# Patient Record
Sex: Male | Born: 1935 | Race: Black or African American | Hispanic: No | Marital: Married | Smoking: Former smoker
Health system: Southern US, Community
[De-identification: ages and names within clinical notes are randomized; demographics above are authoritative.]

## PROBLEM LIST (undated history)

## (undated) DIAGNOSIS — F0390 Unspecified dementia without behavioral disturbance: Secondary | ICD-10-CM

## (undated) DIAGNOSIS — N289 Disorder of kidney and ureter, unspecified: Secondary | ICD-10-CM

## (undated) DIAGNOSIS — I10 Essential (primary) hypertension: Secondary | ICD-10-CM

## (undated) DIAGNOSIS — E785 Hyperlipidemia, unspecified: Secondary | ICD-10-CM

## (undated) DIAGNOSIS — E119 Type 2 diabetes mellitus without complications: Secondary | ICD-10-CM

---

## 2009-05-14 ENCOUNTER — Ambulatory Visit: Payer: Self-pay | Admitting: *Deleted

## 2009-10-18 ENCOUNTER — Emergency Department: Payer: Self-pay | Admitting: Emergency Medicine

## 2009-12-20 ENCOUNTER — Emergency Department: Payer: Self-pay | Admitting: Emergency Medicine

## 2009-12-28 ENCOUNTER — Emergency Department: Payer: Self-pay | Admitting: Emergency Medicine

## 2013-07-03 DEATH — deceased

## 2018-01-04 ENCOUNTER — Encounter: Payer: Self-pay | Admitting: Emergency Medicine

## 2018-01-04 ENCOUNTER — Emergency Department
Admission: EM | Admit: 2018-01-04 | Discharge: 2018-01-04 | Disposition: A | Discharge status: Home / Self Care | Payer: Medicare Other | Attending: Emergency Medicine | Admitting: Emergency Medicine

## 2018-01-04 ENCOUNTER — Other Ambulatory Visit: Payer: Self-pay

## 2018-01-04 DIAGNOSIS — E119 Type 2 diabetes mellitus without complications: Secondary | ICD-10-CM | POA: Insufficient documentation

## 2018-01-04 DIAGNOSIS — F0390 Unspecified dementia without behavioral disturbance: Secondary | ICD-10-CM | POA: Insufficient documentation

## 2018-01-04 DIAGNOSIS — I10 Essential (primary) hypertension: Secondary | ICD-10-CM | POA: Insufficient documentation

## 2018-01-04 DIAGNOSIS — Z87891 Personal history of nicotine dependence: Secondary | ICD-10-CM | POA: Insufficient documentation

## 2018-01-04 DIAGNOSIS — R531 Weakness: Secondary | ICD-10-CM

## 2018-01-04 DIAGNOSIS — R42 Dizziness and giddiness: Secondary | ICD-10-CM

## 2018-01-04 HISTORY — DX: Essential (primary) hypertension: I10

## 2018-01-04 HISTORY — DX: Hyperlipidemia, unspecified: E78.5

## 2018-01-04 HISTORY — DX: Type 2 diabetes mellitus without complications: E11.9

## 2018-01-04 HISTORY — DX: Unspecified dementia, unspecified severity, without behavioral disturbance, psychotic disturbance, mood disturbance, and anxiety: F03.90

## 2018-01-04 HISTORY — DX: Disorder of kidney and ureter, unspecified: N28.9

## 2018-01-04 LAB — URINALYSIS, COMPLETE (UACMP) WITH MICROSCOPIC
Glucose, UA: 50 mg/dL — AB
HGB URINE DIPSTICK: NEGATIVE
Ketones, ur: NEGATIVE mg/dL
LEUKOCYTES UA: NEGATIVE
Nitrite: NEGATIVE
PH: 5 (ref 5.0–8.0)
Protein, ur: 100 mg/dL — AB
SPECIFIC GRAVITY, URINE: 1.024 (ref 1.005–1.030)

## 2018-01-04 LAB — CBC
HCT: 38.8 % — ABNORMAL LOW (ref 40.0–52.0)
Hemoglobin: 13.1 g/dL (ref 13.0–18.0)
MCH: 28.8 pg (ref 26.0–34.0)
MCHC: 33.9 g/dL (ref 32.0–36.0)
MCV: 85 fL (ref 80.0–100.0)
PLATELETS: 290 10*3/uL (ref 150–440)
RBC: 4.56 MIL/uL (ref 4.40–5.90)
RDW: 13.7 % (ref 11.5–14.5)
WBC: 7.8 10*3/uL (ref 3.8–10.6)

## 2018-01-04 LAB — BASIC METABOLIC PANEL
Anion gap: 12 (ref 5–15)
BUN: 30 mg/dL — AB (ref 8–23)
CALCIUM: 9.2 mg/dL (ref 8.9–10.3)
CO2: 21 mmol/L — ABNORMAL LOW (ref 22–32)
Chloride: 104 mmol/L (ref 98–111)
Creatinine, Ser: 2.81 mg/dL — ABNORMAL HIGH (ref 0.61–1.24)
GFR, EST AFRICAN AMERICAN: 22 mL/min — AB (ref >60)
GFR, EST NON AFRICAN AMERICAN: 19 mL/min — AB (ref >60)
Glucose, Bld: 157 mg/dL — ABNORMAL HIGH (ref 70–99)
Potassium: 4.1 mmol/L (ref 3.5–5.1)
SODIUM: 137 mmol/L (ref 135–145)

## 2018-01-04 LAB — TROPONIN I: Troponin I: <0.03 ng/mL (ref <0.03)

## 2018-01-04 MED ORDER — CEPHALEXIN 250 MG PO CAPS: 250.0000 mg | ORAL_CAPSULE | Freq: Four times a day (QID) | ORAL | 0 refills | Status: AC

## 2018-01-04 NOTE — ED Provider Notes (Signed)
[facility] Emergency Department Provider Note       Time seen: ----------------------------------------- 8:50 PM on 01/04/2018 -----------------------------------------   I have reviewed the triage vital signs and the nursing notes.  HISTORY   Chief Complaint Weakness    HPI Shawn May is a 84 y.o. male with a history of dementia, diabetes, hyperlipidemia, hypertension and renal disorder who presents to the ED for creased weakness and dizziness for the past week.  Wife states he has some dementia which has been progressing.  He had a fall today but denies hitting his head.  He does not actually remember this event.  According to EMS blood pressure was running with a systolic in the 41R.  Past Medical History:  Diagnosis Date  . Dementia   . Diabetes mellitus without complication ([place])   . Hyperlipidemia   . Hypertension   . Renal disorder     There are no active problems to display for this patient.   History reviewed. No pertinent surgical history.  Allergies Patient has no known allergies.  Social History Social History   Tobacco Use  . Smoking status: Former Research scientist (life sciences)  . Smokeless tobacco: Never Used  Substance Use Topics  . Alcohol use: Not Currently  . Drug use: Not Currently   Review of Systems Constitutional: Negative for fever. Cardiovascular: Negative for chest pain. Respiratory: Negative for shortness of breath. Gastrointestinal: Negative for abdominal pain, vomiting and diarrhea. Musculoskeletal: Negative for back pain. Skin: Negative for rash. Neurological: Positive for weakness and dizziness  All systems negative/normal/unremarkable except as stated in the HPI  ____________________________________________   PHYSICAL EXAM:  VITAL SIGNS: ED Triage Vitals  Enc Vitals Group     BP 01/04/18 1655 (!) 95/56     Pulse Rate 01/04/18 1655 86     Resp --      Temp 01/04/18 1655 98.2 F (36.8 C)     Temp Source  01/04/18 1655 Oral     SpO2 01/04/18 1655 93 %     Weight 01/04/18 1656 150 lb (68 kg)     Height 01/04/18 1656 5\' 7"  (1.702 m)     Head Circumference --      Peak Flow --      Pain Score 01/04/18 1705 0     Pain Loc --      Pain Edu? --      Excl. in [place]? --    Constitutional: Alert and oriented. Well appearing and in no distress. Eyes: Conjunctivae are normal. Normal extraocular movements. ENT   Head: Normocephalic and atraumatic.   Nose: No congestion/rhinnorhea.   Mouth/Throat: Mucous membranes are moist.   Neck: No stridor. Cardiovascular: Normal rate, regular rhythm. No murmurs, rubs, or gallops. Respiratory: Normal respiratory effort without tachypnea nor retractions. Breath sounds are clear and equal bilaterally. No wheezes/rales/rhonchi. Gastrointestinal: Soft and nontender. Normal bowel sounds Musculoskeletal: Nontender with normal range of motion in extremities. No lower extremity tenderness nor edema. Neurologic:  Normal speech and language. No gross focal neurologic deficits are appreciated.  Skin:  Skin is warm, dry and intact. No rash noted. Psychiatric: Mood and affect are normal. Speech and behavior are normal.  ____________________________________________  EKG: Interpreted by me.  Sinus rhythm the rate 83 bpm, normal PR interval, normal QRS, normal QT  ____________________________________________  ED COURSE:  As part of my medical decision making, I reviewed the following data within the [place] History obtained from family if available, nursing notes, old chart and  ekg, as well as notes from prior ED visits. Patient presented for weakness and dizziness, we will assess with labs and imaging as indicated at this time. Clinical Course as of Jan 04 2149  Fri Jan 04, 2018  2110 Patient was not symptomatic with any orthostatic changes   [JW]    Clinical Course User Index [JW] Earleen Newport, MD    Procedures ____________________________________________   LABS (pertinent positives/negatives)  Labs Reviewed  BASIC METABOLIC PANEL - Abnormal; Notable for the following components:      Result Value   CO2 21 (*)    Glucose, Bld 157 (*)    BUN 30 (*)    Creatinine, Ser 2.81 (*)    GFR calc non Af Amer 19 (*)    GFR calc Af Amer 22 (*)    All other components within normal limits  CBC - Abnormal; Notable for the following components:   HCT 38.8 (*)    All other components within normal limits  URINALYSIS, COMPLETE (UACMP) WITH MICROSCOPIC - Abnormal; Notable for the following components:   Color, Urine AMBER (*)    APPearance CLOUDY (*)    Glucose, UA 50 (*)    Bilirubin Urine SMALL (*)    Protein, ur 100 (*)    Bacteria, UA RARE (*)    All other components within normal limits  TROPONIN I  CBG MONITORING, ED  ___________________________________________  DIFFERENTIAL DIAGNOSIS   Dehydration, electrolyte abnormality, medication side effect, occult infection  FINAL ASSESSMENT AND PLAN  Weakness, dizziness   Plan: The patient had presented for weakness and dizziness. Patient's labs did indicate mild UTI, otherwise he has chronic kidney disease that is unchanged.  Patient was not symptomatic with any orthostatic changes here although his blood pressure did decrease from lying to standing.  I will encourage increase fluid intake as well as holding his HCTZ until close outpatient follow-up with his doctor.   Laurence Aly, MD   Note: This note was generated in part or whole with voice recognition software. Voice recognition is usually quite accurate but there are transcription errors that can and very often do occur. I apologize for any typographical errors that were not detected and corrected.     Earleen Newport, MD 01/04/18 2203

## 2018-01-04 NOTE — ED Triage Notes (Signed)
Pt comes into the ED via PV c/o increased weakness and dizziness for over a weak.  The wife states he has some dementia and it has progressed.  Patient also had a fall today but denies hitting his head.  Patient states he doesn't remember that happening today but his wife witnessed it. Patient has no complaints and denies any pain.

## 2018-01-04 NOTE — ED Notes (Addendum)
First Nurse Note:  Patient presents to the ED via EMS.  Per EMS patient has had multiple falls and dizziness lately.  Per EMS blood pressure was running systolic in the 26E.

## 2018-01-04 NOTE — ED Notes (Signed)
Pt reports dizziness.  States I feel fine.  Denies any pain.  No chest pain or sob.

## 2018-06-28 ENCOUNTER — Ambulatory Visit: Payer: Medicare Other | Admitting: Podiatry

## 2018-06-28 ENCOUNTER — Encounter: Payer: Self-pay | Admitting: Podiatry

## 2018-06-28 VITALS — BP 129/60 | HR 68

## 2018-06-28 DIAGNOSIS — B351 Tinea unguium: Secondary | ICD-10-CM | POA: Diagnosis not present

## 2018-06-28 DIAGNOSIS — M79676 Pain in unspecified toe(s): Secondary | ICD-10-CM | POA: Diagnosis not present

## 2018-06-30 NOTE — Progress Notes (Signed)
   SUBJECTIVE Patient with a history of diabetes mellitus presents to office today complaining of elongated, thickened nails that cause pain while ambulating in shoes. He is unable to trim his own nails. Patient is here for further evaluation and treatment.   Past Medical History:  Diagnosis Date  . Dementia ([place])   . Diabetes mellitus without complication ([place])   . Hyperlipidemia   . Hypertension   . Renal disorder     OBJECTIVE General Patient is awake, alert, and oriented x 3 and in no acute distress. Derm Skin is dry and supple bilateral. Negative open lesions or macerations. Remaining integument unremarkable. Nails are tender, long, thickened and dystrophic with subungual debris, consistent with onychomycosis, 1-5 bilateral. No signs of infection noted. Vasc  DP and PT pedal pulses palpable bilaterally. Temperature gradient within normal limits.  Neuro Epicritic and protective threshold sensation diminished bilaterally.  Musculoskeletal Exam No symptomatic pedal deformities noted bilateral. Muscular strength within normal limits.  ASSESSMENT 1. Diabetes Mellitus w/ peripheral neuropathy 2. Onychomycosis of nail due to dermatophyte bilateral 3. Pain in foot bilateral  PLAN OF CARE 1. Patient evaluated today. 2. Instructed to maintain good pedal hygiene and foot care. Stressed importance of controlling blood sugar.  3. Mechanical debridement of nails 1-5 bilaterally performed using a nail nipper. Filed with dremel without incident.  4. Return to clinic in 3 mos.     Edrick Kins, DPM [facility]  Dr. Edrick Kins, [employer]                                        [place], [place] [address]                Office [phone]  Fax [phone]

## 2018-07-09 ENCOUNTER — Encounter: Payer: Self-pay | Admitting: Podiatry

## 2018-07-09 ENCOUNTER — Ambulatory Visit: Payer: Medicare Other | Admitting: Podiatry

## 2018-07-09 DIAGNOSIS — L600 Ingrowing nail: Secondary | ICD-10-CM | POA: Diagnosis not present

## 2018-07-11 NOTE — Progress Notes (Signed)
   Subjective: Patient presents today for evaluation of pain to the medial border of the left hallux that began a few weeks ago. Patient is concerned for possible ingrown nail. Applying pressure and wearing shoes increases the pain. He has not done anything for treatment. Patient presents today for further treatment and evaluation.  Past Medical History:  Diagnosis Date  . Dementia ([place])   . Diabetes mellitus without complication ([place])   . Hyperlipidemia   . Hypertension   . Renal disorder     Objective:  General: Well developed, nourished, in no acute distress, alert and oriented x3   Dermatology: Skin is warm, dry and supple bilateral. Medial border of the left hallux appears to be erythematous with evidence of an ingrowing nail. Pain on palpation noted to the border of the nail fold. The remaining nails appear unremarkable at this time. There are no open sores, lesions.  Vascular: Dorsalis Pedis artery and Posterior Tibial artery pedal pulses palpable. No lower extremity edema noted.   Neruologic: Grossly intact via light touch bilateral.  Musculoskeletal: Muscular strength within normal limits in all groups bilateral. Normal range of motion noted to all pedal and ankle joints.   Assesement: #1 Paronychia with ingrowing nail medial border left hallux  #2 Pain in toe #3 Incurvated nail  Plan of Care:  1. Patient evaluated.  2. Discussed treatment alternatives and plan of care. Explained nail avulsion procedure and post procedure course to patient. 3. Patient opted for temporary partial nail avulsion of the medial border of the left hallux .  4. Prior to procedure, local anesthesia infiltration utilized using 3 ml of a 50:50 mixture of 2% plain lidocaine and 0.5% plain marcaine in a normal hallux block fashion and a betadine prep performed.  5. Light dressing applied. 6. Return to clinic in 3 weeks.   Edrick Kins, DPM [facility]  Dr. Edrick Kins, [address]                                        [place], [place] [address]                Office [phone]  Fax [phone]

## 2018-07-30 ENCOUNTER — Encounter: Payer: Medicare Other | Admitting: Podiatry

## 2018-08-11 NOTE — Progress Notes (Signed)
This encounter was created in error - please disregard.

## 2018-09-11 ENCOUNTER — Emergency Department: Payer: Medicare Other

## 2018-09-11 ENCOUNTER — Encounter: Payer: Self-pay | Admitting: Emergency Medicine

## 2018-09-11 ENCOUNTER — Observation Stay
Admission: EM | Admit: 2018-09-11 | Discharge: 2018-09-12 | Disposition: A | Discharge status: Home / Self Care | Payer: Medicare Other | Attending: Internal Medicine | Admitting: Internal Medicine

## 2018-09-11 ENCOUNTER — Inpatient Hospital Stay: Payer: Medicare Other

## 2018-09-11 ENCOUNTER — Other Ambulatory Visit: Payer: Self-pay

## 2018-09-11 DIAGNOSIS — E1122 Type 2 diabetes mellitus with diabetic chronic kidney disease: Secondary | ICD-10-CM | POA: Diagnosis not present

## 2018-09-11 DIAGNOSIS — N183 Chronic kidney disease, stage 3 (moderate): Secondary | ICD-10-CM | POA: Diagnosis not present

## 2018-09-11 DIAGNOSIS — R739 Hyperglycemia, unspecified: Secondary | ICD-10-CM

## 2018-09-11 DIAGNOSIS — Z87891 Personal history of nicotine dependence: Secondary | ICD-10-CM | POA: Insufficient documentation

## 2018-09-11 DIAGNOSIS — E785 Hyperlipidemia, unspecified: Secondary | ICD-10-CM | POA: Diagnosis not present

## 2018-09-11 DIAGNOSIS — Z79899 Other long term (current) drug therapy: Secondary | ICD-10-CM | POA: Insufficient documentation

## 2018-09-11 DIAGNOSIS — Z833 Family history of diabetes mellitus: Secondary | ICD-10-CM | POA: Diagnosis not present

## 2018-09-11 DIAGNOSIS — Z66 Do not resuscitate: Secondary | ICD-10-CM | POA: Diagnosis not present

## 2018-09-11 DIAGNOSIS — E1165 Type 2 diabetes mellitus with hyperglycemia: Secondary | ICD-10-CM | POA: Diagnosis not present

## 2018-09-11 DIAGNOSIS — I6381 Other cerebral infarction due to occlusion or stenosis of small artery: Secondary | ICD-10-CM | POA: Diagnosis not present

## 2018-09-11 DIAGNOSIS — F0390 Unspecified dementia without behavioral disturbance: Secondary | ICD-10-CM | POA: Insufficient documentation

## 2018-09-11 DIAGNOSIS — E860 Dehydration: Secondary | ICD-10-CM | POA: Insufficient documentation

## 2018-09-11 DIAGNOSIS — N179 Acute kidney failure, unspecified: Secondary | ICD-10-CM | POA: Diagnosis not present

## 2018-09-11 DIAGNOSIS — Z7984 Long term (current) use of oral hypoglycemic drugs: Secondary | ICD-10-CM | POA: Insufficient documentation

## 2018-09-11 DIAGNOSIS — R2681 Unsteadiness on feet: Secondary | ICD-10-CM | POA: Diagnosis not present

## 2018-09-11 DIAGNOSIS — I6621 Occlusion and stenosis of right posterior cerebral artery: Secondary | ICD-10-CM | POA: Diagnosis not present

## 2018-09-11 DIAGNOSIS — I639 Cerebral infarction, unspecified: Secondary | ICD-10-CM

## 2018-09-11 DIAGNOSIS — E876 Hypokalemia: Secondary | ICD-10-CM | POA: Insufficient documentation

## 2018-09-11 DIAGNOSIS — E871 Hypo-osmolality and hyponatremia: Secondary | ICD-10-CM | POA: Diagnosis not present

## 2018-09-11 DIAGNOSIS — Z8249 Family history of ischemic heart disease and other diseases of the circulatory system: Secondary | ICD-10-CM | POA: Diagnosis not present

## 2018-09-11 DIAGNOSIS — I129 Hypertensive chronic kidney disease with stage 1 through stage 4 chronic kidney disease, or unspecified chronic kidney disease: Secondary | ICD-10-CM | POA: Insufficient documentation

## 2018-09-11 LAB — CBC
HEMATOCRIT: 36.4 % — AB (ref 39.0–52.0)
Hemoglobin: 12.2 g/dL — ABNORMAL LOW (ref 13.0–17.0)
MCH: 27.7 pg (ref 26.0–34.0)
MCHC: 33.5 g/dL (ref 30.0–36.0)
MCV: 82.7 fL (ref 80.0–100.0)
PLATELETS: 274 10*3/uL (ref 150–400)
RBC: 4.4 MIL/uL (ref 4.22–5.81)
RDW: 12.3 % (ref 11.5–15.5)
WBC: 6.7 10*3/uL (ref 4.0–10.5)
nRBC: 0 % (ref 0.0–0.2)

## 2018-09-11 LAB — COMPREHENSIVE METABOLIC PANEL
ALBUMIN: 4.2 g/dL (ref 3.5–5.0)
ALK PHOS: 82 U/L (ref 38–126)
ALT: 16 U/L (ref 0–44)
ANION GAP: 14 (ref 5–15)
AST: 24 U/L (ref 15–41)
BILIRUBIN TOTAL: 1.8 mg/dL — AB (ref 0.3–1.2)
BUN: 39 mg/dL — AB (ref 8–23)
CALCIUM: 9.2 mg/dL (ref 8.9–10.3)
CO2: 18 mmol/L — ABNORMAL LOW (ref 22–32)
Chloride: 96 mmol/L — ABNORMAL LOW (ref 98–111)
Creatinine, Ser: 2.95 mg/dL — ABNORMAL HIGH (ref 0.61–1.24)
GFR calc Af Amer: 22 mL/min — ABNORMAL LOW (ref >60)
GFR, EST NON AFRICAN AMERICAN: 19 mL/min — AB (ref >60)
GLUCOSE: 532 mg/dL — AB (ref 70–99)
POTASSIUM: 4.3 mmol/L (ref 3.5–5.1)
Sodium: 128 mmol/L — ABNORMAL LOW (ref 135–145)
TOTAL PROTEIN: 7.9 g/dL (ref 6.5–8.1)

## 2018-09-11 LAB — DIFFERENTIAL
Abs Immature Granulocytes: 0.06 10*3/uL (ref 0.00–0.07)
Basophils Absolute: 0 10*3/uL (ref 0.0–0.1)
Basophils Relative: 0 %
EOS ABS: 0 10*3/uL (ref 0.0–0.5)
EOS PCT: 0 %
IMMATURE GRANULOCYTES: 1 %
LYMPHS ABS: 0.9 10*3/uL (ref 0.7–4.0)
LYMPHS PCT: 13 %
MONOS PCT: 9 %
Monocytes Absolute: 0.6 10*3/uL (ref 0.1–1.0)
Neutro Abs: 5.1 10*3/uL (ref 1.7–7.7)
Neutrophils Relative %: 77 %

## 2018-09-11 LAB — URINALYSIS, COMPLETE (UACMP) WITH MICROSCOPIC
BACTERIA UA: NONE SEEN
BILIRUBIN URINE: NEGATIVE
Glucose, UA: >=500 mg/dL — AB
KETONES UR: NEGATIVE mg/dL
Leukocytes,Ua: NEGATIVE
Nitrite: NEGATIVE
Protein, ur: 30 mg/dL — AB
Specific Gravity, Urine: 1.021 (ref 1.005–1.030)
pH: 5 (ref 5.0–8.0)

## 2018-09-11 LAB — GLUCOSE, CAPILLARY
GLUCOSE-CAPILLARY: 526 mg/dL — AB (ref 70–99)
Glucose-Capillary: 469 mg/dL — ABNORMAL HIGH (ref 70–99)
Glucose-Capillary: 357 mg/dL — ABNORMAL HIGH (ref 70–99)

## 2018-09-11 LAB — HEMOGLOBIN A1C
Hgb A1c MFr Bld: 11.1 % — ABNORMAL HIGH (ref 4.8–5.6)
Mean Plasma Glucose: 271.87 mg/dL

## 2018-09-11 MED ORDER — SODIUM CHLORIDE 0.9 % IV SOLN
INTRAVENOUS | Status: DC
  Administered 2018-09-11 – 2018-09-12 (×2): via INTRAVENOUS

## 2018-09-11 MED ORDER — INSULIN ASPART 100 UNIT/ML ~~LOC~~ SOLN
0.0000 [IU] | Freq: Every day | SUBCUTANEOUS | Status: DC
  Administered 2018-09-11: 5 [IU] via SUBCUTANEOUS
  Filled 2018-09-11: qty 1

## 2018-09-11 MED ORDER — INSULIN ASPART 100 UNIT/ML ~~LOC~~ SOLN
0.0000 [IU] | Freq: Three times a day (TID) | SUBCUTANEOUS | Status: DC
  Administered 2018-09-12: 4 [IU] via SUBCUTANEOUS
  Administered 2018-09-12: 08:00:00 3 [IU] via SUBCUTANEOUS
  Filled 2018-09-11 (×2): qty 1

## 2018-09-11 MED ORDER — SODIUM CHLORIDE 0.9 % IV SOLN
1000.0000 mL | Freq: Once | INTRAVENOUS | Status: AC
  Administered 2018-09-11: 1000 mL via INTRAVENOUS

## 2018-09-11 MED ORDER — INSULIN GLARGINE 100 UNIT/ML ~~LOC~~ SOLN
20.0000 [IU] | Freq: Every day | SUBCUTANEOUS | Status: DC
Start: 2018-09-11 — End: 2018-09-12
  Administered 2018-09-11 – 2018-09-12 (×2): 20 [IU] via SUBCUTANEOUS
  Filled 2018-09-11 (×3): qty 0.2

## 2018-09-11 MED ORDER — ACETAMINOPHEN 325 MG PO TABS: 650.0000 mg | ORAL_TABLET | ORAL | Status: DC | PRN

## 2018-09-11 MED ORDER — ACETAMINOPHEN 160 MG/5ML PO SOLN
650.0000 mg | ORAL | Status: DC | PRN
  Filled 2018-09-11: qty 20.3

## 2018-09-11 MED ORDER — LISINOPRIL 10 MG PO TABS
10.0000 mg | ORAL_TABLET | Freq: Every day | ORAL | Status: DC
  Administered 2018-09-12: 10 mg via ORAL
  Filled 2018-09-11: qty 1

## 2018-09-11 MED ORDER — ASPIRIN 81 MG PO CHEW
81.0000 mg | CHEWABLE_TABLET | Freq: Every day | ORAL | Status: DC
  Administered 2018-09-11 – 2018-09-12 (×2): 81 mg via ORAL
  Filled 2018-09-11 (×2): qty 1

## 2018-09-11 MED ORDER — HEPARIN SODIUM (PORCINE) 5000 UNIT/ML IJ SOLN
5000.0000 [IU] | Freq: Three times a day (TID) | INTRAMUSCULAR | Status: DC
  Administered 2018-09-11 – 2018-09-12 (×3): 5000 [IU] via SUBCUTANEOUS
  Filled 2018-09-11 (×3): qty 1

## 2018-09-11 MED ORDER — ACETAMINOPHEN 650 MG RE SUPP: 650.0000 mg | RECTAL | Status: DC | PRN

## 2018-09-11 MED ORDER — RISPERIDONE 1 MG PO TBDP
0.5000 mg | ORAL_TABLET | Freq: Two times a day (BID) | ORAL | Status: DC | PRN
  Administered 2018-09-11: 23:00:00 0.5 mg via ORAL
  Filled 2018-09-11 (×2): qty 0.5

## 2018-09-11 MED ORDER — SODIUM BICARBONATE 650 MG PO TABS
650.0000 mg | ORAL_TABLET | Freq: Three times a day (TID) | ORAL | Status: DC
  Administered 2018-09-11 – 2018-09-12 (×2): 650 mg via ORAL
  Filled 2018-09-11 (×5): qty 1

## 2018-09-11 MED ORDER — ATORVASTATIN CALCIUM 20 MG PO TABS
40.0000 mg | ORAL_TABLET | Freq: Every day | ORAL | Status: DC
  Administered 2018-09-11: 40 mg via ORAL
  Filled 2018-09-11: qty 2

## 2018-09-11 MED ORDER — STROKE: EARLY STAGES OF RECOVERY BOOK
Freq: Once | Status: AC
  Administered 2018-09-11: 21:00:00

## 2018-09-11 NOTE — ED Provider Notes (Addendum)
[facility] Emergency Department Provider Note   ____________________________________________    I have reviewed the triage vital signs and the nursing notes.   HISTORY  Chief Complaint Altered Mental Status; Headache; Polydipsia; and Emesis  History is primarily from patient's wife   HPI Shawn May is a 85 y.o. male who presents with slurred speech, possible dehydration, dizziness.  Patient with a history of dementia, diabetes.  Wife reports that on Sunday 3 days ago the patient seemed to become more unsteady on his feet, prior to that he had no difficulty walking.  She also reports that he became more difficult to understand and his speech was slurred.  Additionally he has been keeping her up at night because he is continually "thirsty "and having to urinate he does have a history of diabetes.  Past Medical History:  Diagnosis Date  . Dementia ([place])   . Diabetes mellitus without complication ([place])   . Hyperlipidemia   . Hypertension   . Renal disorder     There are no active problems to display for this patient.   History reviewed. No pertinent surgical history.  Prior to Admission medications   Not on File     Allergies Patient has no known allergies.  No family history on file.  Social History Social History   Tobacco Use  . Smoking status: Former Research scientist (life sciences)  . Smokeless tobacco: Never Used  Substance Use Topics  . Alcohol use: Not Currently  . Drug use: Not Currently    Review of Systems  Constitutional: No fever/chills Eyes: No visual changes.  ENT: "Thirsty " Cardiovascular: Denies chest pain. Respiratory: No cough or shortness of Gastrointestinal:  no vomiting.   Genitourinary: Polyuria Musculoskeletal: Negative for back pain. Skin: Negative for rash. Neurological: Off balance   ____________________________________________   PHYSICAL EXAM:  VITAL SIGNS: ED Triage Vitals  Enc Vitals Group     BP 09/11/18  1244 (!) 147/80     Pulse Rate 09/11/18 1244 97     Resp 09/11/18 1244 16     Temp 09/11/18 1244 98.1 F (36.7 C)     Temp Source 09/11/18 1244 Oral     SpO2 09/11/18 1244 100 %     Weight 09/11/18 1245 74.8 kg (165 lb)     Height 09/11/18 1245 1.651 m (5\' 5" )     Head Circumference --      Peak Flow --      Pain Score --      Pain Loc --      Pain Edu? --      Excl. in [place]? --     Constitutional: Alert and oriented.  Eyes: Conjunctivae are normal.  PERRLA Head: Atraumatic.  Mouth/Throat: Mucous membranes are moist.  Speech is difficult understand Neck:  Painless ROM Cardiovascular: Normal rate, regular rhythm. Grossly normal heart sounds.  Good peripheral circulation. Respiratory: Normal respiratory effort.  No retractions. Lungs CTAB. Gastrointestinal: Soft and nontender. No distention.    Musculoskeletal Warm and well perfused Neurologic: Speech is slurred and difficult to understand, strength appears normal in all extremities, cranial nerves appear normal Skin:  Skin is warm, dry and intact. No rash noted.   ____________________________________________   LABS (all labs ordered are listed, but only abnormal results are displayed)  Labs Reviewed  CBC - Abnormal; Notable for the following components:      Result Value   Hemoglobin 12.2 (*)    HCT 36.4 (*)    All other  components within normal limits  COMPREHENSIVE METABOLIC PANEL - Abnormal; Notable for the following components:   Sodium 128 (*)    Chloride 96 (*)    CO2 18 (*)    Glucose, Bld 532 (*)    BUN 39 (*)    Creatinine, Ser 2.95 (*)    Total Bilirubin 1.8 (*)    GFR calc non Af Amer 19 (*)    GFR calc Af Amer 22 (*)    All other components within normal limits  GLUCOSE, CAPILLARY - Abnormal; Notable for the following components:   Glucose-Capillary 526 (*)    All other components within normal limits  URINALYSIS, COMPLETE (UACMP) WITH MICROSCOPIC - Abnormal; Notable for the following components:    Color, Urine YELLOW (*)    APPearance CLEAR (*)    Glucose, UA >=500 (*)    Hgb urine dipstick SMALL (*)    Protein, ur 30 (*)    All other components within normal limits  DIFFERENTIAL  CBG MONITORING, ED   ____________________________________________  EKG  ED ECG REPORT I, Lavonia Drafts, the attending physician, personally viewed and interpreted this ECG.  Date: 09/11/2018  Rhythm: normal sinus rhythm QRS Axis: normal Intervals: normal ST/T Wave abnormalities: normal Narrative Interpretation: no evidence of acute ischemia  ____________________________________________  RADIOLOGY  CT head unremarkable ____________________________________________   PROCEDURES  Procedure(s) performed: No  Procedures   Critical Care performed: yes  CRITICAL CARE Performed by: Lavonia Drafts   Total critical care time:30 minutes  Critical care time was exclusive of separately billable procedures and treating other patients.  Critical care was necessary to treat or prevent imminent or life-threatening deterioration.  Critical care was time spent personally by me on the following activities: development of treatment plan with patient and/or surrogate as well as nursing, discussions with consultants, evaluation of patient's response to treatment, examination of patient, obtaining history from patient or surrogate, ordering and performing treatments and interventions, ordering and review of laboratory studies, ordering and review of radiographic studies, pulse oximetry and re-evaluation of patient's condition.  ____________________________________________   INITIAL IMPRESSION / ASSESSMENT AND PLAN / ED COURSE  Pertinent labs & imaging results that were available during my care of the patient were reviewed by me and considered in my medical decision making (see chart for details).  Patient presents with slurred speech, worsening balance over the last several days.  Also with reports  of polyuria, polydipsia.  Exam is most consistent with CVA given his speech difficulties.  However he also has notable hyperglycemia which is likely causing significant dehydration which is likely the cause of his polyuria and polydipsia.  CT head demonstrates possible brainstem CVA, chronic or subacute.  Discussed with Dr. Doy Mince of neurology who recommends admission with further evaluation with MRI    ____________________________________________   FINAL CLINICAL IMPRESSION(S) / ED DIAGNOSES  Final diagnoses:  Cerebrovascular accident (CVA), unspecified mechanism ([place])  Hyperglycemia        Note:  This document was prepared using Dragon voice recognition software and may include unintentional dictation errors.   Lavonia Drafts, MD 09/11/18 1516    Lavonia Drafts, MD 09/23/18 [id number]

## 2018-09-11 NOTE — ED Triage Notes (Signed)
Patient says he has dizziness that started this am.  All the time.  Wife says he was acting different Sunday . No recognizing people as usual (has some dementia)  Then yesterday was vomiting.  Also since Saturday he has had excessive thirst and urination.  Has been to dr yesterday and had dementia.

## 2018-09-11 NOTE — Progress Notes (Signed)
Family Meeting Note  Advance Directive:yes  Today a meeting took place with the spouse.  Patient is unable to participate due WU:[id number] capacity dementia   The following clinical team members were present during this meeting:MD ED RN  The following were discussed:Patient's diagnosis: cva Hyperglycemia Hyponatremia Dementia CKD atge 4 acute on chronic , Patient's progosis: Unable to determine and Goals for treatment: DNR  Additional follow-up to be provided: outpatient plaliative care services requested at discharge  Time spent during discussion:16 minutes  Roselle Norton, Ulice Bold, MD

## 2018-09-11 NOTE — H&P (Signed)
[name] NAME: Shawn May    MR#:  [id number]  DATE OF BIRTH:  11-13-1931  DATE OF ADMISSION:  09/11/2018  PRIMARY CARE PHYSICIAN:dr voora  REQUESTING/REFERRING PHYSICIAN: dr Corky Downs  CHIEF COMPLAINT:  Confusion slurred speech HISTORY OF PRESENT ILLNESS:  Shawn May  is a 85 y.o. male with a known history of chronic kidney disease stage III followed by [facility], dementia, diabetes and hypertension who was brought into the emergency room due to above complaint. Patient's wife reports that since Sunday patient has been experiencing dizziness as well as gait unsteadiness.  She also noticed slurred speech this morning.  She reports no other focal neurological deficits.  She does report that he has had excessive thirst and urination.  PAST MEDICAL HISTORY:   Past Medical History:  Diagnosis Date  . Dementia ([place])   . Diabetes mellitus without complication ([place])   . Hyperlipidemia   . Hypertension   . Renal disorder     PAST SURGICAL HISTORY:  none  SOCIAL HISTORY:   Social History   Tobacco Use  . Smoking status: Former Research scientist (life sciences)  . Smokeless tobacco: Never Used  Substance Use Topics  . Alcohol use: Not Currently    FAMILY HISTORY:  No reported history of diabetes or hypertension  DRUG ALLERGIES:  No Known Allergies  REVIEW OF SYSTEMS:   Review of Systems  Constitutional: Positive for malaise/fatigue. Negative for chills and fever.  HENT: Negative.  Negative for ear discharge, ear pain, hearing loss, nosebleeds and sore throat.   Eyes: Negative.  Negative for blurred vision and pain.  Respiratory: Negative.  Negative for cough, hemoptysis, shortness of breath and wheezing.   Cardiovascular: Negative.  Negative for chest pain, palpitations and leg swelling.  Gastrointestinal: Negative.  Negative for abdominal pain, blood in stool, diarrhea, nausea and vomiting.  Genitourinary: Negative.  Negative for dysuria.   Musculoskeletal: Negative.  Negative for back pain.  Skin: Negative.   Neurological: Positive for dizziness, speech change, focal weakness and weakness. Negative for tremors, seizures and headaches.  Endo/Heme/Allergies: Positive for polydipsia. Does not bruise/bleed easily.  Psychiatric/Behavioral: Positive for memory loss. Negative for depression, hallucinations and suicidal ideas.    MEDICATIONS AT HOME:   Prior to Admission medications   Medication Sig Start Date End Date Taking? Authorizing Provider  sodium bicarbonate 650 MG tablet Take 650 mg by mouth 2 (two) times daily. 08/13/18  Yes [provider]  memantine (NAMENDA) 5 MG tablet Take 5 mg by mouth daily. 09/10/18   [provider]      VITAL SIGNS:  Blood pressure 136/88, pulse 90, temperature 98.1 F (36.7 C), temperature source Oral, resp. rate 17, height 5\' 5"  (1.651 m), weight 74.8 kg, SpO2 99 %.  PHYSICAL EXAMINATION:   Physical Exam Constitutional:      General: He is not in acute distress.    Appearance: He is well-developed.  HENT:     Head: Normocephalic.     Mouth/Throat:     Mouth: Mucous membranes are moist.  Eyes:     General: No scleral icterus. Neck:     Musculoskeletal: Normal range of motion and neck supple.     Vascular: No JVD.     Trachea: No tracheal deviation.  Cardiovascular:     Rate and Rhythm: Normal rate and regular rhythm.     Heart sounds: Normal heart sounds. No murmur. No friction rub. No gallop.   Pulmonary:  Effort: Pulmonary effort is normal. No respiratory distress.     Breath sounds: Normal breath sounds. No wheezing or rales.  Chest:     Chest wall: No tenderness.  Abdominal:     General: Bowel sounds are normal. There is no distension.     Palpations: Abdomen is soft. There is no mass.     Tenderness: There is no abdominal tenderness. There is no guarding or rebound.  Musculoskeletal: Normal range of motion.  Skin:    General: Skin is warm.      Findings: No erythema or rash.  Neurological:     Mental Status: He is alert. Mental status is at baseline. He is confused.     Cranial Nerves: Dysarthria present.     Sensory: No sensory deficit.     Motor: No weakness.     Coordination: Coordination normal.     Comments: Left facial droop spurred speech  Psychiatric:        Cognition and Memory: Memory is impaired.        Judgment: Judgment normal.       LABORATORY PANEL:   CBC Recent Labs  Lab 09/11/18 1246  WBC 6.7  HGB 12.2*  HCT 36.4*  PLT 274   ------------------------------------------------------------------------------------------------------------------  Chemistries  Recent Labs  Lab 09/11/18 1246  NA 128*  K 4.3  CL 96*  CO2 18*  GLUCOSE 532*  BUN 39*  CREATININE 2.95*  CALCIUM 9.2  AST 24  ALT 16  ALKPHOS 82  BILITOT 1.8*   ------------------------------------------------------------------------------------------------------------------  Cardiac Enzymes No results for input(s): TROPONINI in the last 168 hours. ------------------------------------------------------------------------------------------------------------------  RADIOLOGY:  Ct Head Wo Contrast  Result Date: 09/11/2018 CLINICAL DATA:  Altered level of consciousness. Dizziness. Change in behavior. EXAM: CT HEAD WITHOUT CONTRAST TECHNIQUE: Contiguous axial images were obtained from the base of the skull through the vertex without intravenous contrast. COMPARISON:  None. FINDINGS: Brain: There is a focal area of low attenuation within the left side of brainstem, image number 8/3 compatible with either a subacute or chronic lacunar infarct. No findings identified to suggest cortical infarct, or intracranial hemorrhage. 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 No mass effect. On the right this measures 6 mm in thickness. On the left this measures 8 mm in thickness. There is marked diffuse low-attenuation within the  subcortical and periventricular white matter compatible with chronic microvascular disease. Prominence of the sulci and ventricles compatible with brain atrophy Vascular: No hyperdense vessel or unexpected calcification. Skull: Normal. Negative for fracture or focal lesion. Sinuses/Orbits: The paranasal sinuses and mastoid air cells are clear. Other: None. IMPRESSION: 1. Advanced chronic small vessel ischemic disease and brain atrophy. Within the left side of brainstem there is an age indeterminate, either subacute or chronic, lacunar infarct measuring approximately 8 mm. 2. 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 Electronically Signed   By: Kerby Moors M.D.   On: 09/11/2018 13:29    EKG:   Orders placed or performed during the hospital encounter of 09/11/18  . EKG 12-Lead  . EKG 12-Lead  . ED EKG  . ED EKG    IMPRESSION AND PLAN:   85 year old male with history of dementia, chronic kidney disease stage III followed at [facility], diabetes and hypertension who presents to the emergency room with dizziness, gait unsteadiness and polydipsia.  1.  Dizziness with unsteady gait concerning for acute CVA.  CT shows age indeterminant subacute or chronic lacunar infarct measuring approximately 8 mm. ER physician has spoken  with Dr. Doy Mince who is recommending CVA work-up including MRI, MRA of the brain, carotid Doppler, echocardiogram. Start aspirin and continue statin Check lipid panel and A1c PT, OT speech consultation pending  2.  Hyperglycemia with uncontrolled diabetes: This is etiology of patient's polydipsia/polyuria.   Start Lantus 20 units at bedtime  Start sliding scale  Check A1c  Diabetes nurse consultation for further recommendations  Hold oral medications for now   3.  Acute on chronic kidney disease stage III with baseline creatinine 1.92: Acute kidney injury is due to dehydration from hyperglycemia . Hold nephrotoxic medications and start IV fluids  BMP in  a.m.   4.  Hyponatremia: This is pseudohyponatremia and corrects with elevated glucose.    5.Hyperlipidemia: Continue statin   6.  Dementia: Patient is at baseline   All the records are reviewed and case discussed with ED provider. Management plans discussed with the patient's wife  and she in agreement  CODE STATUS: DNR TOTAL TIME TAKING CARE OF THIS PATIENT: 44 minutes.    Temesgen Weightman M.D on 09/11/2018 at 3:28 PM  Between 7am to 6pm - Pager - [phone]  After 6pm go to [email or web address] - password [email or web address] [facility]  Office  [phone]  CC: Primary care physician; System, Pcp Not In

## 2018-09-11 NOTE — ED Notes (Signed)
Gave report to [facility] on on 1C.

## 2018-09-11 NOTE — ED Notes (Signed)
Patient transported to MRI prior to arriving to floor bed. Kurtis Bushman, RN aware.

## 2018-09-11 NOTE — ED Notes (Signed)
ED TO INPATIENT HANDOFF REPORT  ED Nurse Name and Phone #: [facility] Name/Age/Gender Shawn May 85 y.o. male Room/Bed: ED24A/ED24A  Code Status   Code Status: DNR  Home/SNF/Other Home Patient oriented to: self Is this baseline? No   Triage Complete: Triage complete  Chief Complaint weakness  Triage Note Patient says he has dizziness that started this am.  All the time.  Wife says he was acting different Sunday . No recognizing people as usual (has some dementia)  Then yesterday was vomiting.  Also since Saturday he has had excessive thirst and urination.  Has been to dr yesterday and had dementia.     Allergies No Known Allergies  Level of Care/Admitting Diagnosis ED Disposition    ED Disposition Condition [facility]: [place] [[id number]]  Level of Care: Med-Surg [16]  Diagnosis: CVA (cerebral vascular accident) [facility]) [[id number]]  Admitting Physician: MODY, Ulice Bold [[id number]]  Attending Physician: MODY, Ulice Bold [[id number]]  Estimated length of stay: 3 - 4 days  Certification:: I certify this patient will need inpatient services for at least 2 midnights  PT Class (Do Not Modify): Inpatient [101]  PT Acc Code (Do Not Modify): Private [1]       B Medical/Surgery History Past Medical History:  Diagnosis Date  . Dementia ([place])   . Diabetes mellitus without complication ([place])   . Hyperlipidemia   . Hypertension   . Renal disorder    History reviewed. No pertinent surgical history.   A IV Location/Drains/Wounds Patient Lines/Drains/Airways Status   Active Line/Drains/Airways    Name:   Placement date:   Placement time:   Site:   Days:   Peripheral IV 09/11/18 Right Forearm   09/11/18    1553    Forearm   less than 1          Intake/Output Last 24 hours  Intake/Output Summary (Last 24 hours) at 09/11/2018 1740 Last data filed at 09/11/2018 1615 Gross per 24 hour  Intake 1000 ml  Output -  Net 1000 ml     Labs/Imaging Results for orders placed or performed during the hospital encounter of 09/11/18 (from the past 48 hour(s))  CBC     Status: Abnormal   Collection Time: 09/11/18 12:46 PM  Result Value Ref Range   WBC 6.7 4.0 - 10.5 K/uL   RBC 4.40 4.22 - 5.81 MIL/uL   Hemoglobin 12.2 (L) 13.0 - 17.0 g/dL   HCT 36.4 (L) 39.0 - 52.0 %   MCV 82.7 80.0 - 100.0 fL   MCH 27.7 26.0 - 34.0 pg   MCHC 33.5 30.0 - 36.0 g/dL   RDW 12.3 11.5 - 15.5 %   Platelets 274 150 - 400 K/uL   nRBC 0.0 0.0 - 0.2 %    Comment: Performed at [facility], [address] [place], [place] [address]  Differential     Status: None   Collection Time: 09/11/18 12:46 PM  Result Value Ref Range   Neutrophils Relative % 77 %   Neutro Abs 5.1 1.7 - 7.7 K/uL   Lymphocytes Relative 13 %   Lymphs Abs 0.9 0.7 - 4.0 K/uL   Monocytes Relative 9 %   Monocytes Absolute 0.6 0.1 - 1.0 K/uL   Eosinophils Relative 0 %   Eosinophils Absolute 0.0 0.0 - 0.5 K/uL   Basophils Relative 0 %   Basophils Absolute 0.0 0.0 - 0.1 K/uL   Immature Granulocytes 1 %  Abs Immature Granulocytes 0.06 0.00 - 0.07 K/uL    Comment: Performed at [facility], [address] [place], [place] [address]  Comprehensive metabolic panel     Status: Abnormal   Collection Time: 09/11/18 12:46 PM  Result Value Ref Range   Sodium 128 (L) 135 - 145 mmol/L   Potassium 4.3 3.5 - 5.1 mmol/L   Chloride 96 (L) 98 - 111 mmol/L   CO2 18 (L) 22 - 32 mmol/L   Glucose, Bld 532 (HH) 70 - 99 mg/dL    Comment: CRITICAL RESULT CALLED TO, READ BACK BY AND VERIFIED WITH ANGELA ROBBINS 09/11/18 1343 KLW    BUN 39 (H) 8 - 23 mg/dL   Creatinine, Ser 2.95 (H) 0.61 - 1.24 mg/dL   Calcium 9.2 8.9 - 10.3 mg/dL   Total Protein 7.9 6.5 - 8.1 g/dL   Albumin 4.2 3.5 - 5.0 g/dL   AST 24 15 - 41 U/L   ALT 16 0 - 44 U/L   Alkaline Phosphatase 82 38 - 126 U/L   Total Bilirubin 1.8 (H) 0.3 - 1.2 mg/dL   GFR calc non Af Amer 19 (L) >60 mL/min   GFR calc  Af Amer 22 (L) >60 mL/min   Anion gap 14 5 - 15    Comment: Performed at [facility], [address] [place], [place] [address]  Glucose, capillary     Status: Abnormal   Collection Time: 09/11/18 12:48 PM  Result Value Ref Range   Glucose-Capillary 526 (HH) 70 - 99 mg/dL   Comment 1 Notify RN   Urinalysis, Complete w Microscopic     Status: Abnormal   Collection Time: 09/11/18 12:49 PM  Result Value Ref Range   Color, Urine YELLOW (A) YELLOW   APPearance CLEAR (A) CLEAR   Specific Gravity, Urine 1.021 1.005 - 1.030   pH 5.0 5.0 - 8.0   Glucose, UA >=500 (A) NEGATIVE mg/dL   Hgb urine dipstick SMALL (A) NEGATIVE   Bilirubin Urine NEGATIVE NEGATIVE   Ketones, ur NEGATIVE NEGATIVE mg/dL   Protein, ur 30 (A) NEGATIVE mg/dL   Nitrite NEGATIVE NEGATIVE   Leukocytes,Ua NEGATIVE NEGATIVE   RBC / HPF 0-5 0 - 5 RBC/hpf   WBC, UA 0-5 0 - 5 WBC/hpf   Bacteria, UA NONE SEEN NONE SEEN   Squamous Epithelial / LPF 0-5 0 - 5   Mucus PRESENT    Hyaline Casts, UA PRESENT    Granular Casts, UA PRESENT     Comment: Performed at [facility], [address] [place], [place] [address]  Glucose, capillary     Status: Abnormal   Collection Time: 09/11/18  4:13 PM  Result Value Ref Range   Glucose-Capillary 469 (H) 70 - 99 mg/dL   Ct Head Wo Contrast  Result Date: 09/11/2018 CLINICAL DATA:  Altered level of consciousness. Dizziness. Change in behavior. EXAM: CT HEAD WITHOUT CONTRAST TECHNIQUE: Contiguous axial images were obtained from the base of the skull through the vertex without intravenous contrast. COMPARISON:  None. FINDINGS: Brain: There is a focal area of low attenuation within the left side of brainstem, image number 8/3 compatible with either a subacute or chronic lacunar infarct. No findings identified to suggest cortical infarct, or intracranial hemorrhage. 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 No mass effect. On the right this measures 6 mm  in thickness. On the left this measures 8 mm in thickness. There is marked diffuse low-attenuation within the subcortical and periventricular  white matter compatible with chronic microvascular disease. Prominence of the sulci and ventricles compatible with brain atrophy Vascular: No hyperdense vessel or unexpected calcification. Skull: Normal. Negative for fracture or focal lesion. Sinuses/Orbits: The paranasal sinuses and mastoid air cells are clear. Other: None. IMPRESSION: 1. Advanced chronic small vessel ischemic disease and brain atrophy. Within the left side of brainstem there is an age indeterminate, either subacute or chronic, lacunar infarct measuring approximately 8 mm. 2. 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 Electronically Signed   By: Kerby Moors M.D.   On: 09/11/2018 13:29    Pending Labs Unresulted Labs (From admission, onward)    Start     Ordered   09/12/18 0500  Hemoglobin A1c  Tomorrow morning,   STAT     09/11/18 1526   09/12/18 0500  Lipid panel  Tomorrow morning,   STAT    Comments:  Fasting    09/11/18 1526   09/12/18 6010  Basic metabolic panel  Tomorrow morning,   STAT     09/11/18 1539   09/11/18 1524  CBC  (heparin)  Once,   STAT    Comments:  Baseline for heparin therapy IF NOT ALREADY DRAWN.  Notify MD if PLT < 100 K.    09/11/18 1526   09/11/18 1524  Creatinine, serum  (heparin)  Once,   STAT    Comments:  Baseline for heparin therapy IF NOT ALREADY DRAWN.    09/11/18 1526   09/11/18 1521  Hemoglobin A1c  Once,   STAT     09/11/18 1526          Vitals/Pain Today's Vitals   09/11/18 1431 09/11/18 1530 09/11/18 1600 09/11/18 1616  BP:  (!) 182/94 (!) 178/98   Pulse:   89   Resp:   14   Temp:      TempSrc:      SpO2:   100%   Weight:      Height:      PainSc: 0-No pain   0-No pain    Isolation Precautions No active isolations  Medications Medications  insulin glargine (LANTUS) injection 20 Units (has no  administration in time range)   stroke: mapping our early stages of recovery book (has no administration in time range)  0.9 %  sodium chloride infusion (has no administration in time range)  acetaminophen (TYLENOL) tablet 650 mg (has no administration in time range)    Or  acetaminophen (TYLENOL) solution 650 mg (has no administration in time range)    Or  acetaminophen (TYLENOL) suppository 650 mg (has no administration in time range)  heparin injection 5,000 Units (has no administration in time range)  insulin aspart (novoLOG) injection 0-20 Units (has no administration in time range)  insulin aspart (novoLOG) injection 0-5 Units (has no administration in time range)  aspirin chewable tablet 81 mg (81 mg Oral Given 09/11/18 1614)  atorvastatin (LIPITOR) tablet 40 mg (has no administration in time range)  lisinopril (PRINIVIL,ZESTRIL) tablet 10 mg (has no administration in time range)  sodium bicarbonate tablet 650 mg (has no administration in time range)  risperiDONE (RISPERDAL M-TABS) disintegrating tablet 0.5 mg (has no administration in time range)  0.9 %  sodium chloride infusion (0 mLs Intravenous Stopped 09/11/18 1615)    Mobility walks with person assist Low fall risk   Focused Assessments Neuro Assessment Handoff:  Swallow screen pass? Yes  Cardiac Rhythm: Normal sinus rhythm NIH Stroke Scale ( + Modified Stroke Scale Criteria)  Level  of Consciousness (1a.)   : Alert, keenly responsive LOC Questions (1b. )   +: Answers both questions correctly LOC Commands (1c. )   + : Performs both tasks correctly Best Gaze (2. )  +: Normal Visual (3. )  +: No visual loss Facial Palsy (4. )    : Minor paralysis Motor Arm, Left (5a. )   +: No drift Motor Arm, Right (5b. )   +: No drift Motor Leg, Left (6a. )   +: No drift Motor Leg, Right (6b. )   +: No drift Limb Ataxia (7. ): Absent Sensory (8. )   +: Normal, no sensory loss Best Language (9. )   +: Mild-to-moderate  aphasia Dysarthria (10. ): Mild-to-moderate dysarthria, patient slurs at least some words and, at worst, can be understood with some difficulty Extinction/Inattention (11.)   +: No Abnormality Modified SS Total  +: 1 Complete NIHSS TOTAL: 3     Neuro Assessment: Exceptions to WDL Neuro Checks:      Last Documented NIHSS Modified Score: 1 (09/11/18 1433) Has TPA been given? No If patient is a Neuro Trauma and patient is going to OR before floor call report to [facility] nurse: [phone] or [phone]     R Recommendations: See Admitting Provider Note  Report given to:   Additional Notes: Patient has hx of dementia but is more confused and not at baseline per wife at bedside.

## 2018-09-11 NOTE — ED Notes (Signed)
Pt confused, attempt to get out of bed. RN assisted pt back into bed. Family member remains at bedside. Yellow non-skid socks and Falls arm band applied.

## 2018-09-11 NOTE — ED Notes (Signed)
Pt removed PIV.

## 2018-09-12 ENCOUNTER — Inpatient Hospital Stay
Admit: 2018-09-12 | Discharge: 2018-09-12 | Disposition: A | Discharge status: Home / Self Care | Payer: Medicare Other | Attending: Internal Medicine | Admitting: Internal Medicine

## 2018-09-12 ENCOUNTER — Inpatient Hospital Stay: Payer: Medicare Other

## 2018-09-12 DIAGNOSIS — R4182 Altered mental status, unspecified: Secondary | ICD-10-CM

## 2018-09-12 LAB — BASIC METABOLIC PANEL
Anion gap: 12 (ref 5–15)
BUN: 36 mg/dL — ABNORMAL HIGH (ref 8–23)
CO2: 19 mmol/L — ABNORMAL LOW (ref 22–32)
Calcium: 9.1 mg/dL (ref 8.9–10.3)
Chloride: 106 mmol/L (ref 98–111)
Creatinine, Ser: 2.09 mg/dL — ABNORMAL HIGH (ref 0.61–1.24)
GFR calc Af Amer: 33 mL/min — ABNORMAL LOW (ref >60)
GFR calc non Af Amer: 28 mL/min — ABNORMAL LOW (ref >60)
Glucose, Bld: 107 mg/dL — ABNORMAL HIGH (ref 70–99)
POTASSIUM: 3.2 mmol/L — AB (ref 3.5–5.1)
Sodium: 137 mmol/L (ref 135–145)

## 2018-09-12 LAB — LIPID PANEL
Cholesterol: 210 mg/dL — ABNORMAL HIGH (ref 0–200)
HDL: 51 mg/dL (ref >40)
LDL Cholesterol: 136 mg/dL — ABNORMAL HIGH (ref 0–99)
Total CHOL/HDL Ratio: 4.1 RATIO
Triglycerides: 113 mg/dL (ref <150)
VLDL: 23 mg/dL (ref 0–40)

## 2018-09-12 LAB — GLUCOSE, CAPILLARY
Glucose-Capillary: 137 mg/dL — ABNORMAL HIGH (ref 70–99)
Glucose-Capillary: 187 mg/dL — ABNORMAL HIGH (ref 70–99)

## 2018-09-12 LAB — HEMOGLOBIN A1C
Hgb A1c MFr Bld: 11.1 % — ABNORMAL HIGH (ref 4.8–5.6)
MEAN PLASMA GLUCOSE: 271.87 mg/dL

## 2018-09-12 MED ORDER — ASPIRIN 81 MG PO CHEW: 81.0000 mg | CHEWABLE_TABLET | Freq: Every day | ORAL | 2 refills | Status: AC

## 2018-09-12 MED ORDER — POTASSIUM CHLORIDE CRYS ER 20 MEQ PO TBCR
40.0000 meq | EXTENDED_RELEASE_TABLET | Freq: Once | ORAL | Status: AC
  Administered 2018-09-12: 13:00:00 40 meq via ORAL
  Filled 2018-09-12: qty 2

## 2018-09-12 NOTE — Consult Note (Addendum)
Referring Physician: Demetrios Loll, MD    Chief Complaint: Confusion, dizziness  HPI: Shawn May is an 85 y.o. male with past medical history of dementia, diabetes mellitus, hyperlipidemia, hypertension, and chronic kidney disease presenting to the ED with chief complaints of confusion, dizziness, worsening balance and slurred speech.  Patient is demented therefore history mostly obtained from patient's chart, patient's wife and daughter who are currently at the bedside.  Per patient's wife, patient was acting "differently" on Sunday, it was not able to recognize people and appeared confused.  Patient also was complaining of dizziness, excessive thirst and urination therefore wife took him to his PCP who recommended that patient be seen in the ED for further evaluation.On arrival to the ED, he was afebrile with blood pressure 147/80 mm Hg and pulse rate 97 beats/min.  Initial NIH stroke scale 3.  There were no focal neurological deficits; he was alert and oriented x4.  Initial labs revealed elevated blood glucose of 532, sodium 128, BUN 39, creatinine 2.95, GFR 19, normal complete blood count (CBC), hemoglobin A1c 11.1, urinalysis negative for UTI. ECG showed sinus rhythm of 98 beats per minute. A non-contrast head CT showed age indeterminate subacute or chronic lacunar infarct within the left side of brainstem.  Follow-up MRI brain showed no acute intracranial abnormality.  MRA head showed focal severe stenosis of the right PCA P2 segment and mild to moderate direct stenosis of the left PCA P2 segment.  Patient was admitted for further stroke work-up and management.  Date last known well: Date: 09/08/2018 Time last known well: Unable to determine tPA Given: No: Outside time window  Past Medical History:  Diagnosis Date  . Dementia ([place])   . Diabetes mellitus without complication ([place])   . Hyperlipidemia   . Hypertension   . Renal disorder     History reviewed. No pertinent surgical  history.  No family history on file. Medical History Relation Name Comments  Diabetes Mother    Hypertension Mother     Relation Name Status Comments  Father  Deceased   Mother  Deceased (Age 30)     Social History:  reports that he has quit smoking. He has never used smokeless tobacco. He reports previous alcohol use. He reports previous drug use.  Allergies: No Known Allergies  Medications:  I have reviewed the patient's current medications. Prior to Admission:  Medications Prior to Admission  Medication Sig Dispense Refill Last Dose  . atorvastatin (LIPITOR) 40 MG tablet Take 40 mg by mouth daily.   unknown at unknown  . cholecalciferol (VITAMIN D3) 25 MCG (1000 UT) tablet Take 2,000 Units by mouth daily.   unknown at unknown  . donepezil (ARICEPT) 5 MG tablet Take 5 mg by mouth at bedtime.   unknown at unknown  . glipiZIDE (GLUCOTROL XL) 10 MG 24 hr tablet Take 10 mg by mouth 2 (two) times daily.   unknown at unknown  . hydrochlorothiazide (HYDRODIURIL) 25 MG tablet Take 25 mg by mouth daily.   unknown at unknown  . linagliptin (TRADJENTA) 5 MG TABS tablet Take 5 mg by mouth daily.   unknown at Unknown time  . memantine (NAMENDA) 5 MG tablet Take 5 mg by mouth 2 (two) times daily.    unknown at unknown  . quinapril (ACCUPRIL) 10 MG tablet Take 10 mg by mouth daily.   unknown at unknown  . sodium bicarbonate 650 MG tablet Take 650 mg by mouth 2 (two) times daily.   unknown at unknown  Scheduled: . aspirin  81 mg Oral Daily  . atorvastatin  40 mg Oral q1800  . heparin  5,000 Units Subcutaneous Q8H  . insulin aspart  0-20 Units Subcutaneous TID WC  . insulin aspart  0-5 Units Subcutaneous QHS  . insulin glargine  20 Units Subcutaneous Daily  . lisinopril  10 mg Oral Daily  . sodium bicarbonate  650 mg Oral TID    ROS: Patient is demented unable to to obtain ROS   Physical Examination: Blood pressure (!) 160/95, pulse 90, temperature (!) 97.4 F (36.3 C), resp.  rate (!) 22, height 5\' 5"  (1.651 m), weight 74.8 kg, SpO2 99 %.   HEENT-  Normocephalic, no lesions, without obvious abnormality.  Normal external eye and conjunctiva.  Normal TM's bilaterally.  Normal auditory canals and external ears. Normal external nose, mucus membranes and septum.  Normal pharynx. Cardiovascular- S1, S2 normal, pulses palpable throughout   Lungs- chest clear, no wheezing, rales, normal symmetric air entry Abdomen- soft, non-tender; bowel sounds normal; no masses,  no organomegaly Extremities- no edema Lymph-no adenopathy palpable Musculoskeletal-no joint tenderness, deformity or swelling Skin-warm and dry, no hyperpigmentation, vitiligo, or suspicious lesions  Neurological Exam   Mental Status: Alert, oriented to person and place. Was able to identify wife and daughter by name and relationship to him. thought content appropriate.  Speech fluent without evidence of aphasia.  Able to follow 3 step commands without difficulty. Attention span and concentration seemed appropriate  Cranial Nerves: II: Discs flat bilaterally; Visual fields grossly normal, pupils equal, round, reactive to light and accommodation III,IV, VI: ptosis not present, extra-ocular motions intact bilaterally V,VII: smile symmetric, facial light touch sensation intact VIII: hearing normal bilaterally IX,X: gag reflex present XI: bilateral shoulder shrug XII: midline tongue extension Motor: Right :  Upper extremity   5/5 Without pronator drift      Left: Upper extremity   5/5 without pronator drift Right:   Lower extremity   5/5                                          Left: Lower extremity   5/5 Tone and bulk:normal tone throughout; no atrophy noted Sensory: Pinprick and light touch intact bilaterally Deep Tendon Reflexes: 2+ and symmetric throughout Plantars: Right: mute                              Left: mute Cerebellar: Finger-to-nose testing intact bilaterally. Heel to shin testing normal  bilaterally Gait: not tested due to safety concerns  Data Reviewed  Laboratory Studies:  Basic Metabolic Panel: Recent Labs  Lab 09/11/18 1246 09/12/18 0540  NA 128* 137  K 4.3 3.2*  CL 96* 106  CO2 18* 19*  GLUCOSE 532* 107*  BUN 39* 36*  CREATININE 2.95* 2.09*  CALCIUM 9.2 9.1    Liver Function Tests: Recent Labs  Lab 09/11/18 1246  AST 24  ALT 16  ALKPHOS 82  BILITOT 1.8*  PROT 7.9  ALBUMIN 4.2   No results for input(s): LIPASE, AMYLASE in the last 168 hours. No results for input(s): AMMONIA in the last 168 hours.  CBC: Recent Labs  Lab 09/11/18 1246  WBC 6.7  NEUTROABS 5.1  HGB 12.2*  HCT 36.4*  MCV 82.7  PLT 274    Cardiac Enzymes: No results for input(s): CKTOTAL, CKMB,  CKMBINDEX, TROPONINI in the last 168 hours.  BNP: Invalid input(s): POCBNP  CBG: Recent Labs  Lab 09/11/18 1248 09/11/18 1613 09/11/18 2156 09/12/18 0749  GLUCAP 526* 469* 357* 137*    Microbiology: No results found for this or any previous visit.  Coagulation Studies: No results for input(s): LABPROT, INR in the last 72 hours.  Urinalysis:  Recent Labs  Lab 09/11/18 1249  COLORURINE YELLOW*  LABSPEC 1.021  PHURINE 5.0  GLUCOSEU >=500*  HGBUR SMALL*  BILIRUBINUR NEGATIVE  KETONESUR NEGATIVE  PROTEINUR 30*  NITRITE NEGATIVE  LEUKOCYTESUR NEGATIVE    Lipid Panel:    Component Value Date/Time   CHOL 210 (H) 09/12/2018 0540   TRIG 113 09/12/2018 0540   HDL 51 09/12/2018 0540   CHOLHDL 4.1 09/12/2018 0540   VLDL 23 09/12/2018 0540   LDLCALC 136 (H) 09/12/2018 0540    HgbA1C:  Lab Results  Component Value Date   HGBA1C 11.1 (H) 09/12/2018    Urine Drug Screen:  No results found for: LABOPIA, COCAINSCRNUR, LABBENZ, AMPHETMU, THCU, LABBARB  Alcohol Level: No results for input(s): ETH in the last 168 hours.  Other results: EKG: normal EKG, normal sinus rhythm, unchanged from previous tracings. Vent. rate 94 BPM PR interval 130 ms QRS duration 76  ms QT/QTc 352/440 ms P-R-T axes 51 -26 62  Imaging: Ct Head Wo Contrast  Result Date: 09/11/2018 CLINICAL DATA:  Altered level of consciousness. Dizziness. Change in behavior. EXAM: CT HEAD WITHOUT CONTRAST TECHNIQUE: Contiguous axial images were obtained from the base of the skull through the vertex without intravenous contrast. COMPARISON:  None. FINDINGS: Brain: There is a focal area of low attenuation within the left side of brainstem, image number 8/3 compatible with either a subacute or chronic lacunar infarct. No findings identified to suggest cortical infarct, or intracranial hemorrhage. 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 No mass effect. On the right this measures 6 mm in thickness. On the left this measures 8 mm in thickness. There is marked diffuse low-attenuation within the subcortical and periventricular white matter compatible with chronic microvascular disease. Prominence of the sulci and ventricles compatible with brain atrophy Vascular: No hyperdense vessel or unexpected calcification. Skull: Normal. Negative for fracture or focal lesion. Sinuses/Orbits: The paranasal sinuses and mastoid air cells are clear. Other: None. IMPRESSION: 1. Advanced chronic small vessel ischemic disease and brain atrophy. Within the left side of brainstem there is an age indeterminate, either subacute or chronic, lacunar infarct measuring approximately 8 mm. 2. 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 Electronically Signed   By: Kerby Moors M.D.   On: 09/11/2018 13:29   Mr Brain Wo Contrast  Result Date: 09/11/2018 CLINICAL DATA:  Stroke follow-up EXAM: MRI HEAD WITHOUT CONTRAST MRA HEAD WITHOUT CONTRAST TECHNIQUE: Multiplanar, multiecho pulse sequences of the brain and surrounding structures were obtained without intravenous contrast. Angiographic images of the head were obtained using MRA technique without contrast. COMPARISON:  Head CT 09/11/2018 FINDINGS:  MRI HEAD FINDINGS BRAIN: There is no acute infarct, acute hemorrhage or mass effect. The midline structures are normal. There are multiple old, punctate infarcts of the cerebellum and basal ganglia. Diffuse confluent hyperintense T2-weighted signal within the periventricular, deep and juxtacortical white matter, most commonly due to chronic ischemic microangiopathy. Generalized atrophy without lobar predilection. Susceptibility-sensitive sequences show no chronic microhemorrhage or superficial siderosis. SKULL AND UPPER CERVICAL SPINE: The visualized skull base, calvarium, upper cervical spine and extracranial soft tissues are normal. SINUSES/ORBITS: No fluid levels or advanced  mucosal thickening. No mastoid or middle ear effusion. The orbits are normal. MRA HEAD FINDINGS POSTERIOR CIRCULATION: --Basilar artery: Normal. --Posterior cerebral arteries: There is severe stenosis of the proximal right P2 segment. Moderate diffuse narrowing of the left P2 segment. --Superior cerebellar arteries: Normal. --Inferior cerebellar arteries: Normal right anterior and bilateral posterior inferior cerebellar arteries. Left AICA not clearly visualized. ANTERIOR CIRCULATION: --Intracranial internal carotid arteries: Normal. --Anterior cerebral arteries: Normal. Both A1 segments are present. Patent anterior communicating artery. --Middle cerebral arteries: Normal. --Posterior communicating arteries: The left P-comm partially supplies the left PCA. The right P-comm is absent or diminutive. IMPRESSION: 1. No acute ischemia or hemorrhage. 2. 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 3. Long segment mild-to-moderate stenosis of the left PCA P2 segment. Electronically Signed   By: Ulyses Jarred M.D.   On: 09/11/2018 19:09   US Carotid Bilateral (at [facility] And [facility] Only)  Result Date: 09/12/2018 CLINICAL DATA:  Hypertension, syncope, visual disturbance EXAM: BILATERAL CAROTID DUPLEX ULTRASOUND TECHNIQUE: Pearline Cables scale imaging,  color Doppler and duplex ultrasound were performed of bilateral carotid and vertebral arteries in the neck. COMPARISON:  None. FINDINGS: Criteria: Quantification of carotid stenosis is based on velocity parameters that correlate the residual internal carotid diameter with NASCET-based stenosis levels, using the diameter of the distal internal carotid lumen as the denominator for stenosis measurement. The following velocity measurements were obtained: RIGHT ICA: 74/28 cm/sec CCA: 801/65 cm/sec SYSTOLIC ICA/CCA RATIO:  0.5 ECA: 74 cm/sec LEFT ICA: 77/29 cm/sec CCA: 53/74 cm/sec SYSTOLIC ICA/CCA RATIO:  0.9 ECA: 77 cm/sec RIGHT CAROTID ARTERY: Minor echogenic shadowing plaque formation. No hemodynamically significant right ICA stenosis, velocity elevation, or turbulent flow. Degree of narrowing less than 50%. RIGHT VERTEBRAL ARTERY:  Not visualized LEFT CAROTID ARTERY: Similar scattered minor echogenic plaque formation. No hemodynamically significant left ICA stenosis, velocity elevation, or turbulent flow. LEFT VERTEBRAL ARTERY:  Not visualized IMPRESSION: Minor carotid atherosclerosis. No hemodynamically significant ICA stenosis. Degree of narrowing less than 50% bilaterally by ultrasound criteria. Nonvisualization of the vertebral arteries bilaterally Electronically Signed   By: Jerilynn Mages.  Shick M.D.   On: 09/12/2018 10:23   Mr Jodene Nam Head/brain MO Cm  Result Date: 09/11/2018 CLINICAL DATA:  Stroke follow-up EXAM: MRI HEAD WITHOUT CONTRAST MRA HEAD WITHOUT CONTRAST TECHNIQUE: Multiplanar, multiecho pulse sequences of the brain and surrounding structures were obtained without intravenous contrast. Angiographic images of the head were obtained using MRA technique without contrast. COMPARISON:  Head CT 09/11/2018 FINDINGS: MRI HEAD FINDINGS BRAIN: There is no acute infarct, acute hemorrhage or mass effect. The midline structures are normal. There are multiple old, punctate infarcts of the cerebellum and basal ganglia.  Diffuse confluent hyperintense T2-weighted signal within the periventricular, deep and juxtacortical white matter, most commonly due to chronic ischemic microangiopathy. Generalized atrophy without lobar predilection. Susceptibility-sensitive sequences show no chronic microhemorrhage or superficial siderosis. SKULL AND UPPER CERVICAL SPINE: The visualized skull base, calvarium, upper cervical spine and extracranial soft tissues are normal. SINUSES/ORBITS: No fluid levels or advanced mucosal thickening. No mastoid or middle ear effusion. The orbits are normal. MRA HEAD FINDINGS POSTERIOR CIRCULATION: --Basilar artery: Normal. --Posterior cerebral arteries: There is severe stenosis of the proximal right P2 segment. Moderate diffuse narrowing of the left P2 segment. --Superior cerebellar arteries: Normal. --Inferior cerebellar arteries: Normal right anterior and bilateral posterior inferior cerebellar arteries. Left AICA not clearly visualized. ANTERIOR CIRCULATION: --Intracranial internal carotid arteries: Normal. --Anterior cerebral arteries: Normal. Both A1 segments are present. Patent anterior communicating artery. --Middle cerebral arteries:  Normal. --Posterior communicating arteries: The left P-comm partially supplies the left PCA. The right P-comm is absent or diminutive. IMPRESSION: 1. No acute ischemia or hemorrhage. 2. 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 3. Long segment mild-to-moderate stenosis of the left PCA P2 segment. Electronically Signed   By: Ulyses Jarred M.D.   On: 09/11/2018 19:09   Patient seen and examined.  Clinical course and management discussed.  Necessary edits performed.  I agree with the above.  Assessment and plan of care developed and discussed below.   Assessment: 85 y.o. male with past medical history of dementia, diabetes mellitus, hyperlipidemia, hypertension, and chronic kidney disease presenting to the ED with chief complaints of confusion, dizziness,  worsening balance and slurred speech. Initial concerns for CVA, however labs revealed elevated blood glucose of over 500 and decreased kidney function from baseline.  Etiology likely metabolic for presenting symptoms.  CT head reviewed and shows age indeterminate subacute or chronic lacunar infarct within the left side of brainstem.  Follow-up MRI brain showed no acute intracranial abnormality.  MRA head showed focal severe stenosis of the right PCA P2 segment and mild to moderate direct stenosis of the left PCA P2 segment. US carotids shows no hemodynamically significant ICA stenosis.  Hemoglobin A1c 11.,  LDL 136.  Patient was not on any anticoagulation or antiplatelet prior to this event. Although etiology of presentation likely metabolic, patient with evidence of ischemic disease on imaging and would likely benefit from risk factor modification.    Stroke Risk Factors - diabetes mellitus, family history, hyperlipidemia and hypertension  Plan: 1. Start Aspirin 81 mg/day  2. Aggressive lipid management with goal and low density lipoprotein (LDL) <70 mg/dl 3. Glycemic control with goal HgbA1c <7 4. PT consult, OT consult, Speech consult 5. Echocardiogram pending 6. Agree with addressing metabolic issues   This patient was staffed with Dr. Magda Paganini, Doy Mince who personally evaluated patient, reviewed documentation and agreed with assessment and plan of care as above.  Rufina Falco, DNP, FNP-BC Board certified Nurse Practitioner Neurology Department   09/12/2018, 11:34 AM  No further neurologic intervention is recommended at this time.  If further questions arise, please call or page at that time.  Thank you for allowing neurology to participate in the care of this patient.  Alexis Goodell, MD Neurology [phone]  09/12/2018  1:02 PM

## 2018-09-12 NOTE — Care Management CC44 (Signed)
Condition Code 44 Documentation Completed  Patient Details  Name: Shawn May MRN: [id number] Date of Birth: 27-Mar-1932   Condition Code 44 given:  Yes Patient signature on Condition Code 44 notice:  Yes Documentation of 2 MD's agreement:  Yes Code 44 added to claim:  Yes    Shelbie Ammons, RN 09/12/2018, 2:50 PM

## 2018-09-12 NOTE — Progress Notes (Signed)
**Note Shawn-Identified via Obfuscation** OT Cancellation Note  Patient Details Name: [name] MRN: [id number] DOB: [birth year]   Cancelled Treatment:    Reason Eval/Treat Not Completed: Patient at procedure or test/ unavailable. Consult received, chart reviewed. Pt out of room for testing. Will re-attempt OT evaluation at later time as pt is available and medically appropriate.   Jeni Salles, MPH, MS, OTR/L ascom [phone] 09/12/18, 10:11 AM

## 2018-09-12 NOTE — Care Management Obs Status (Signed)
[facility] NOTIFICATION   Patient Details  Name: Shawn May MRN: [id number] Date of Birth: 1932-03-04   Medicare Observation Status Notification Given:       Shelbie Ammons, RN 09/12/2018, 2:48 PM

## 2018-09-12 NOTE — Care Management CC44 (Signed)
Condition Code 44 Documentation Completed  Patient Details  Name: Shawn May MRN: [id number] Date of Birth: 08/16/1931   Condition Code 44 given:    Patient signature on Condition Code 44 notice:    Documentation of 2 MD's agreement:    Code 44 added to claim:       Shelbie Ammons, RN 09/12/2018, 2:48 PM

## 2018-09-12 NOTE — Progress Notes (Addendum)
Inpatient Diabetes Program Recommendations  AACE/ADA: New Consensus Statement on Inpatient Glycemic Control   Target Ranges:  Prepandial:   less than 140 mg/dL      Peak postprandial:   less than 180 mg/dL (1-2 hours)      Critically ill patients:  140 - 180 mg/dL   Results for KEKOA, FYOCK (MRN [id number]) as of 09/12/2018 10:12  Ref. Range 09/11/2018 12:48 09/11/2018 16:13 09/11/2018 21:56 09/12/2018 07:49  Glucose-Capillary Latest Ref Range: 70 - 99 mg/dL 526 (HH) 469 (H) 357 (H) 137 (H)   Results for ASHLAND, WISEMAN (MRN [id number]) as of 09/12/2018 10:12  Ref. Range 09/11/2018 12:46  Hemoglobin A1C Latest Ref Range: 4.8 - 5.6 % 11.1 (H)   Review of Glycemic Control  Diabetes history: DM2 Outpatient Diabetes medications: Tradjenta 5 mg daily, Glipizide XL 10 mg BID Current orders for Inpatient glycemic control: Lantus 20 units daily, Novolog 0-20 units TID with meals, Novolog 0-5 units QHS  Inpatient Diabetes Program Recommendations: HgbA1C: A1C 11.1% on 09/11/18 indicating an average glucose of 272 mg/dl over the past 2-3 months. Initial glucose 526 mg/dl on labs and patient has history of dementia. Question if patient is taking DM medications as prescribed.   NOTE: Noted consult for Diabetes Coordinator. Chart reviewed. Agree with current orders for DM control. Will follow.  Addendum 09/12/18@10 :00-Spoke with patient and his wife about diabetes and home regimen for diabetes control. Patient's wife contributed most of the information discussed about DM.  Patient reports that he is followed by PCP for diabetes management and that is taking DM medications.  However, patient's wife states that patient has pill packs to ensure he is taking medications but he is NOT taking medications as prescribed. She states that she tries to help him and get him to take his medications but he is tells her that he has already taken them and refuses to take them from her.  Patient is prescribed Glipizide XL 10  mg BID and Tradjenta 5 mg daily. Patient's wife states that patient has never been on insulin and she did not really want to have to give him insulin.  Patient's wife reports that she checks her husbands glucose when he will let her (once every couple of days) and it is usually in the 200's mg/dl.  Discussed A1C results (11.1% on 09/11/18) and explained that his current A1C indicates an average glucose of 272 mg/dl over the past 2-3 months which correlates with reported glucose values at home. Explained to patient that he is going to have to let his wife or someone else help him with his medications to ensure he is actually taking them everyday. Explained that if he does not take his oral medications and diabetes continues to be out of control, he may need insulin which his wife will have to give him.  Patient states that he does not want to have to take a shot for DM and that he will let his wife help him with medications. However, due to patient's dementia he may not let his wife help with DM control.  Discussed glucose and A1C goals. Discussed importance of checking CBGs and maintaining good CBG control to prevent long-term and short-term complications. Encouraged patient and his wife to have patient follow up with his PCP regarding DM control.  Patient verbalized understanding of information discussed and he states that he has no further questions at this time related to diabetes.  Thanks, Barnie Alderman, RN, MSN, CDE Diabetes Coordinator Inpatient Diabetes Program  [phone] (Team Pager from [place] to [place])

## 2018-09-12 NOTE — Progress Notes (Signed)
*  PRELIMINARY RESULTS* Echocardiogram 2D Echocardiogram has been performed.  Shawn May 09/12/2018, 1:35 PM

## 2018-09-12 NOTE — Discharge Instructions (Signed)
HHPT Diet control, compliance to medication.

## 2018-09-12 NOTE — Plan of Care (Signed)
  Problem: Education: Goal: Knowledge of General Education information will improve Description Including pain rating scale, medication(s)/side effects and non-pharmacologic comfort measures Outcome: Progressing   Problem: Health Behavior/Discharge Planning: Goal: Ability to manage health-related needs will improve Outcome: Progressing   Problem: Clinical Measurements: Goal: Ability to maintain clinical measurements within normal limits will improve Outcome: Progressing Goal: Will remain free from infection Outcome: Progressing Goal: Diagnostic test results will improve Outcome: Progressing Goal: Respiratory complications will improve Outcome: Progressing Goal: Cardiovascular complication will be avoided Outcome: Progressing   Problem: Activity: Goal: Risk for activity intolerance will decrease Outcome: Progressing   Problem: Nutrition: Goal: Adequate nutrition will be maintained Outcome: Progressing   Problem: Coping: Goal: Level of anxiety will decrease Outcome: Progressing   Problem: Elimination: Goal: Will not experience complications related to bowel motility Outcome: Progressing Goal: Will not experience complications related to urinary retention Outcome: Progressing   Problem: Pain Managment: Goal: General experience of comfort will improve Outcome: Progressing   Problem: Safety: Goal: Ability to remain free from injury will improve Outcome: Progressing   Problem: Skin Integrity: Goal: Risk for impaired skin integrity will decrease Outcome: Progressing   Problem: Education: Goal: Knowledge of secondary prevention will improve Outcome: Progressing Goal: Knowledge of patient specific risk factors addressed and post discharge goals established will improve Outcome: Progressing   Problem: Coping: Goal: Will verbalize positive feelings about self Outcome: Progressing   Problem: Health Behavior/Discharge Planning: Goal: Ability to manage health-related  needs will improve Outcome: Progressing   Problem: Self-Care: Goal: Ability to participate in self-care as condition permits will improve Outcome: Progressing Goal: Ability to communicate needs accurately will improve Outcome: Progressing   Problem: Nutrition: Goal: Risk of aspiration will decrease Outcome: Progressing   Problem: Ischemic Stroke/TIA Tissue Perfusion: Goal: Complications of ischemic stroke/TIA will be minimized Outcome: Progressing

## 2018-09-12 NOTE — Progress Notes (Signed)
SLP Note  Patient Details Name: Shawn May MRN: [id number] DOB: 04-25-1932   Cancelled treatment:       Reason Eval/Treat Not Completed: SLP screened, no needs identified, will sign off. Chart reviewed; NSG consulted. ST met pt walking in the hallway w/ PT and conversed briefly - pt appropriately answered questions, demonstrated eye contact, responded to humor, and spoke w/ 100% intelligibility to ST (unfamiliar listener). ~15 minutes later, pt had just received breakfast tray upon ST entry to room and decided to come back for Speech/Swallowing screen - ST provided pt w/ standard Straw rather than large Straw w/ jug to lessen volume of liquid. Upon return, pt and family member (wife?) reported pt ate breakfast w/ no immediate, overt s/s of aspiration noted (throat clearing, coughing, choking). When asked about his speech, pt reported he feels his speech sounds normal/baseline to him in terms of clarity, volume, pace; family agreed. Pt answered all questions clearly and appropriately - was 100% intelligible to ST during screen. ST to s/o - No further Skilled ST services needed at this time. NSG to re-consult if any changes in status while admitted. NSG consulted and agreed.    Emeline General, Graduate Student SLP 09/12/2018, 11:09 AM

## 2018-09-12 NOTE — Discharge Summary (Addendum)
[facility] [place] NAME: Shawn May    MR#:  [id number]  DATE OF BIRTH:  April 18, 1932  DATE OF ADMISSION:  09/11/2018   ADMITTING PHYSICIAN: Bettey Costa, MD  DATE OF DISCHARGE: 09/12/2018  PRIMARY CARE PHYSICIAN: Franciso Bend, MD   ADMISSION DIAGNOSIS:  Hyperglycemia [R73.9] Stroke (cerebrum) (HCC) [I63.9] Cerebrovascular accident (CVA), unspecified mechanism ([place]) [I63.9] DISCHARGE DIAGNOSIS:  Active Problems:   CVA (cerebral vascular accident) ([place])  SECONDARY DIAGNOSIS:   Past Medical History:  Diagnosis Date   Dementia ([place])    Diabetes mellitus without complication ([place])    Hyperlipidemia    Hypertension    Renal disorder    HOSPITAL COURSE:  85 year old male with history of dementia, chronic kidney disease stage III followed at [facility], diabetes and hypertension who presents to the emergency room with dizziness, gait unsteadiness and polydipsia.  1.  Dizziness with unsteady gait concerning for acute CVA.  CT shows age indeterminant subacute or chronic lacunar infarct measuring approximately 8 mm.  MRI did not report any CVA. MRA: 1. No acute ischemia or hemorrhage. 2. 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 3. Long segment mild-to-moderate stenosis of the left PCA P2 Segment. Carotid Doppler is unremarkable, echocardiogram is pending. Started aspirin and continue statin Check lipid panel: LDL 136 and A1c 11.1 PT, OT: No home PT.  2.  Hyperglycemia with uncontrolled diabetes: This is etiology of patient's polydipsia/polyuria.   Started Lantus 20 units at bedtime and sliding scale  Advised the patient about diet control and compliance to medication.  3.  Acute on chronic kidney disease stage III with baseline creatinine 1.92: Acute kidney injury is due to dehydration from hyperglycemia . Improving with IV fluids .   4.  Hyponatremia: This is pseudohyponatremia and corrects with elevated glucose.     Improved.  5.Hyperlipidemia: Continue statin, LDL 136. Goal<70.  6.  Dementia: Patient is at baseline   Hypokalemia.  Potassium supplement. Hypertension.  Continue home hypertension medication. DISCHARGE CONDITIONS:  Stable, discharge to home today. CONSULTS OBTAINED:  Treatment Team:  Alexis Goodell, MD DRUG ALLERGIES:  No Known Allergies DISCHARGE MEDICATIONS:   Allergies as of 09/12/2018   No Known Allergies     Medication List    TAKE these medications   aspirin 81 MG chewable tablet Chew 1 tablet (81 mg total) by mouth daily. Start taking on:  September 13, 2018   atorvastatin 40 MG tablet Commonly known as:  LIPITOR Take 40 mg by mouth daily.   cholecalciferol 25 MCG (1000 UT) tablet Commonly known as:  VITAMIN D3 Take 2,000 Units by mouth daily.   donepezil 5 MG tablet Commonly known as:  ARICEPT Take 5 mg by mouth at bedtime.   glipiZIDE 10 MG 24 hr tablet Commonly known as:  GLUCOTROL XL Take 10 mg by mouth 2 (two) times daily.   hydrochlorothiazide 25 MG tablet Commonly known as:  HYDRODIURIL Take 25 mg by mouth daily.   linagliptin 5 MG Tabs tablet Commonly known as:  TRADJENTA Take 5 mg by mouth daily.   memantine 5 MG tablet Commonly known as:  NAMENDA Take 5 mg by mouth 2 (two) times daily.   quinapril 10 MG tablet Commonly known as:  ACCUPRIL Take 10 mg by mouth daily.   sodium bicarbonate 650 MG tablet Take 650 mg by mouth 2 (two) times daily.        DISCHARGE INSTRUCTIONS:  See AVS.  If you  experience worsening of your admission symptoms, develop shortness of breath, life threatening emergency, suicidal or homicidal thoughts you must seek medical attention immediately by calling 911 or calling your MD immediately  if symptoms less severe.  You Must read complete instructions/literature along with all the possible adverse reactions/side effects for all the Medicines you take and that have been prescribed to you. Take any new  Medicines after you have completely understood and accpet all the possible adverse reactions/side effects.   Please note  You were cared for by a hospitalist during your hospital stay. If you have any questions about your discharge medications or the care you received while you were in the hospital after you are discharged, you can call the unit and asked to speak with the hospitalist on call if the hospitalist that took care of you is not available. Once you are discharged, your primary care physician will handle any further medical issues. Please note that NO REFILLS for any discharge medications will be authorized once you are discharged, as it is imperative that you return to your primary care physician (or establish a relationship with a primary care physician if you do not have one) for your aftercare needs so that they can reassess your need for medications and monitor your lab values.    On the day of Discharge:  VITAL SIGNS:  Blood pressure (!) 160/95, pulse 90, temperature (!) 97.4 F (36.3 C), resp. rate (!) 22, height 5\' 5"  (1.651 m), weight 74.8 kg, SpO2 99 %. PHYSICAL EXAMINATION:  GENERAL:  85 y.o.-year-old patient lying in the bed with no acute distress.  EYES: Pupils equal, round, reactive to light and accommodation. No scleral icterus. Extraocular muscles intact.  HEENT: Head atraumatic, normocephalic. Oropharynx and nasopharynx clear.  NECK:  Supple, no jugular venous distention. No thyroid enlargement, no tenderness.  LUNGS: Normal breath sounds bilaterally, no wheezing, rales,rhonchi or crepitation. No use of accessory muscles of respiration.  CARDIOVASCULAR: S1, S2 normal. No murmurs, rubs, or gallops.  ABDOMEN: Soft, non-tender, non-distended. Bowel sounds present. No organomegaly or mass.  EXTREMITIES: No pedal edema, cyanosis, or clubbing.  NEUROLOGIC: Cranial nerves II through XII are intact. Muscle strength 5/5 in all extremities. Sensation intact. Gait not checked.   PSYCHIATRIC: The patient is alert and oriented x 3.  SKIN: No obvious rash, lesion, or ulcer.  DATA REVIEW:   CBC Recent Labs  Lab 09/11/18 1246  WBC 6.7  HGB 12.2*  HCT 36.4*  PLT 274    Chemistries  Recent Labs  Lab 09/11/18 1246 09/12/18 0540  NA 128* 137  K 4.3 3.2*  CL 96* 106  CO2 18* 19*  GLUCOSE 532* 107*  BUN 39* 36*  CREATININE 2.95* 2.09*  CALCIUM 9.2 9.1  AST 24  --   ALT 16  --   ALKPHOS 82  --   BILITOT 1.8*  --      Microbiology Results  No results found for this or any previous visit.  RADIOLOGY:  Mr Brain Wo Contrast  Result Date: 09/11/2018 CLINICAL DATA:  Stroke follow-up EXAM: MRI HEAD WITHOUT CONTRAST MRA HEAD WITHOUT CONTRAST TECHNIQUE: Multiplanar, multiecho pulse sequences of the brain and surrounding structures were obtained without intravenous contrast. Angiographic images of the head were obtained using MRA technique without contrast. COMPARISON:  Head CT 09/11/2018 FINDINGS: MRI HEAD FINDINGS BRAIN: There is no acute infarct, acute hemorrhage or mass effect. The midline structures are normal. There are multiple old, punctate infarcts of the cerebellum and basal ganglia.  Diffuse confluent hyperintense T2-weighted signal within the periventricular, deep and juxtacortical white matter, most commonly due to chronic ischemic microangiopathy. Generalized atrophy without lobar predilection. Susceptibility-sensitive sequences show no chronic microhemorrhage or superficial siderosis. SKULL AND UPPER CERVICAL SPINE: The visualized skull base, calvarium, upper cervical spine and extracranial soft tissues are normal. SINUSES/ORBITS: No fluid levels or advanced mucosal thickening. No mastoid or middle ear effusion. The orbits are normal. MRA HEAD FINDINGS POSTERIOR CIRCULATION: --Basilar artery: Normal. --Posterior cerebral arteries: There is severe stenosis of the proximal right P2 segment. Moderate diffuse narrowing of the left P2 segment. --Superior  cerebellar arteries: Normal. --Inferior cerebellar arteries: Normal right anterior and bilateral posterior inferior cerebellar arteries. Left AICA not clearly visualized. ANTERIOR CIRCULATION: --Intracranial internal carotid arteries: Normal. --Anterior cerebral arteries: Normal. Both A1 segments are present. Patent anterior communicating artery. --Middle cerebral arteries: Normal. --Posterior communicating arteries: The left P-comm partially supplies the left PCA. The right P-comm is absent or diminutive. IMPRESSION: 1. No acute ischemia or hemorrhage. 2. 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 3. Long segment mild-to-moderate stenosis of the left PCA P2 segment. Electronically Signed   By: Ulyses Jarred M.D.   On: 09/11/2018 19:09   US Carotid Bilateral (at [facility] Only)  Result Date: 09/12/2018 CLINICAL DATA:  Hypertension, syncope, visual disturbance EXAM: BILATERAL CAROTID DUPLEX ULTRASOUND TECHNIQUE: Pearline Cables scale imaging, color Doppler and duplex ultrasound were performed of bilateral carotid and vertebral arteries in the neck. COMPARISON:  None. FINDINGS: Criteria: Quantification of carotid stenosis is based on velocity parameters that correlate the residual internal carotid diameter with NASCET-based stenosis levels, using the diameter of the distal internal carotid lumen as the denominator for stenosis measurement. The following velocity measurements were obtained: RIGHT ICA: 74/28 cm/sec CCA: 660/63 cm/sec SYSTOLIC ICA/CCA RATIO:  0.5 ECA: 74 cm/sec LEFT ICA: 77/29 cm/sec CCA: 01/60 cm/sec SYSTOLIC ICA/CCA RATIO:  0.9 ECA: 77 cm/sec RIGHT CAROTID ARTERY: Minor echogenic shadowing plaque formation. No hemodynamically significant right ICA stenosis, velocity elevation, or turbulent flow. Degree of narrowing less than 50%. RIGHT VERTEBRAL ARTERY:  Not visualized LEFT CAROTID ARTERY: Similar scattered minor echogenic plaque formation. No hemodynamically significant left ICA stenosis,  velocity elevation, or turbulent flow. LEFT VERTEBRAL ARTERY:  Not visualized IMPRESSION: Minor carotid atherosclerosis. No hemodynamically significant ICA stenosis. Degree of narrowing less than 50% bilaterally by ultrasound criteria. Nonvisualization of the vertebral arteries bilaterally Electronically Signed   By: Jerilynn Mages.  Shick M.D.   On: 09/12/2018 10:23   Mr Jodene Nam Head/brain FU Cm  Result Date: 09/11/2018 CLINICAL DATA:  Stroke follow-up EXAM: MRI HEAD WITHOUT CONTRAST MRA HEAD WITHOUT CONTRAST TECHNIQUE: Multiplanar, multiecho pulse sequences of the brain and surrounding structures were obtained without intravenous contrast. Angiographic images of the head were obtained using MRA technique without contrast. COMPARISON:  Head CT 09/11/2018 FINDINGS: MRI HEAD FINDINGS BRAIN: There is no acute infarct, acute hemorrhage or mass effect. The midline structures are normal. There are multiple old, punctate infarcts of the cerebellum and basal ganglia. Diffuse confluent hyperintense T2-weighted signal within the periventricular, deep and juxtacortical white matter, most commonly due to chronic ischemic microangiopathy. Generalized atrophy without lobar predilection. Susceptibility-sensitive sequences show no chronic microhemorrhage or superficial siderosis. SKULL AND UPPER CERVICAL SPINE: The visualized skull base, calvarium, upper cervical spine and extracranial soft tissues are normal. SINUSES/ORBITS: No fluid levels or advanced mucosal thickening. No mastoid or middle ear effusion. The orbits are normal. MRA HEAD FINDINGS POSTERIOR CIRCULATION: --Basilar artery: Normal. --Posterior cerebral arteries: There is severe  stenosis of the proximal right P2 segment. Moderate diffuse narrowing of the left P2 segment. --Superior cerebellar arteries: Normal. --Inferior cerebellar arteries: Normal right anterior and bilateral posterior inferior cerebellar arteries. Left AICA not clearly visualized. ANTERIOR CIRCULATION:  --Intracranial internal carotid arteries: Normal. --Anterior cerebral arteries: Normal. Both A1 segments are present. Patent anterior communicating artery. --Middle cerebral arteries: Normal. --Posterior communicating arteries: The left P-comm partially supplies the left PCA. The right P-comm is absent or diminutive. IMPRESSION: 1. No acute ischemia or hemorrhage. 2. 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 3. Long segment mild-to-moderate stenosis of the left PCA P2 segment. Electronically Signed   By: Ulyses Jarred M.D.   On: 09/11/2018 19:09     Management plans discussed with the patient, his wife and daughter and they are in agreement.  CODE STATUS: DNR   TOTAL TIME TAKING CARE OF THIS PATIENT: 32 minutes.    Demetrios Loll M.D on 09/12/2018 at 2:02 PM  Between 7am to 6pm - Pager - [phone]  After 6pm go to [email or web address] - Proofreader  [facility]  Office  [phone]  CC: Primary care physician; Franciso Bend, MD   Note: This dictation was prepared with Dragon dictation along with smaller phrase technology. Any transcriptional errors that result from this process are unintentional.

## 2018-09-12 NOTE — Evaluation (Signed)
Physical Therapy Evaluation Patient Details Name: Shawn May MRN: [id number] DOB: 1932/06/23 Today's Date: 09/12/2018   History of Present Illness  presented to ER sceondary to dizziness, gait instability, slurred speech; admitted for TIA/CVA work up and management of uncontrolled DM/hyperglycemia (FSBS >500 upon admission to hospital).  MRI negative for acute ischemia, hemorrhage; noted with severe stenosis R PCA  Clinical Impression  Upon evaluation, patient alert and oriented; oriented to self, basic information and follows simple commands.  Generally confused to more complex, new information; noted difficulty with STM and overall safety awareness/insight.  Bilat UE/LE strength and ROM grossly symmetrical and WFL; no focal weakness, sensory deficit appreciated.  Able to complete bed mobility with mod indep; sit/stand, basic transfers and gait (75' with RW, cga/min assist; 100' without assist device, cga/close sup), cga/close sup.  Improved trunk rotation and arm swing, improved stepping pattern and overall gait performance during trial without assist device.  Completes head turns, start/stop and changes of direction with only minor gait deviations (able to self-correct as needed). Notably safer and more fluid without use of RW; do not recommend continued use of walker or other assist device at this time.  PAtient/family in agreement. Patient appears to be at/near baseline level of functional ability; no acute PT needs identified.  Will complete initial order at this time.  Please re-consult should needs change.    Follow Up Recommendations No PT follow up    Equipment Recommendations       Recommendations for Other Services       Precautions / Restrictions Precautions Precautions: Fall Restrictions Weight Bearing Restrictions: No      Mobility  Bed Mobility Overal bed mobility: Modified Independent                Transfers Overall transfer level: Needs  assistance Equipment used: Rolling walker (2 wheeled) Transfers: Sit to/from Stand Sit to Stand: Min guard         General transfer comment: cuing for hand placement, walker management  Ambulation/Gait Ambulation/Gait assistance: Min guard Gait Distance (Feet): 75 Feet Assistive device: Rolling walker (2 wheeled)       General Gait Details: reciprocal stepping pattern, R LE intermittently crossing midline (esp with turns); difficulty with walker managemnet/placement (likely is greater hazard than help with mobility)  Stairs            Wheelchair Mobility    Modified Rankin (Stroke Patients Only)       Balance Overall balance assessment: Needs assistance Sitting-balance support: No upper extremity supported;Feet supported Sitting balance-Leahy Scale: Good     Standing balance support: No upper extremity supported Standing balance-Leahy Scale: Fair                               Pertinent Vitals/Pain Pain Assessment: No/denies pain    Home Living Family/patient expects to be discharged to:: Private residence Living Arrangements: Spouse/significant other Available Help at Discharge: Family;Available 24 hours/day Type of Home: House Home Access: Stairs to enter Entrance Stairs-Rails: Right;Left;Can reach both Entrance Stairs-Number of Steps: 5 Home Layout: One level        Prior Function Level of Independence: Independent         Comments: Indep with ADLs, household and limited community mobilization without assist device; denies fall history.     Hand Dominance        Extremity/Trunk Assessment   Upper Extremity Assessment Upper Extremity Assessment: Overall WFL for tasks  assessed    Lower Extremity Assessment Lower Extremity Assessment: Overall WFL for tasks assessed(grossly at least 4/5 throughout; no focal weakness, sensory deficit appreciated)       Communication   Communication: No difficulties  Cognition  Arousal/Alertness: Awake/alert Behavior During Therapy: WFL for tasks assessed/performed Overall Cognitive Status: History of cognitive impairments - at baseline                                 General Comments: oriented to self, general location; follows commands; limited insight and recall of new information      General Comments      Exercises Other Exercises Other Exercises: 100' without assist device, cga/close sup-improved trunk rotation and arm swing, improved stepping pattern and overall gait performance.  Completes head turns, start/stop and changes of direction with only minor gait deviations (able to self-correct as needed). Notably safer and more fluid without use of RW   Assessment/Plan    PT Assessment Patent does not need any further PT services  PT Problem List Decreased mobility       PT Treatment Interventions DME instruction;Gait training;Functional mobility training;Therapeutic activities;Balance training;Cognitive remediation;Patient/family education    PT Goals (Current goals can be found in the Care Plan section)  Acute Rehab PT Goals Patient Stated Goal: to return home PT Goal Formulation: All assessment and education complete, DC therapy Time For Goal Achievement: 09/12/18 Potential to Achieve Goals: Good    Frequency     Barriers to discharge        Co-evaluation               AM-PAC PT "6 Clicks" Mobility  Outcome Measure Help needed turning from your back to your side while in a flat bed without using bedrails?: None Help needed moving from lying on your back to sitting on the side of a flat bed without using bedrails?: None Help needed moving to and from a bed to a chair (including a wheelchair)?: A Little Help needed standing up from a chair using your arms (e.g., wheelchair or bedside chair)?: A Little Help needed to walk in hospital room?: A Little Help needed climbing 3-5 steps with a railing? : A Little 6 Click  Score: 20    End of Session Equipment Utilized During Treatment: Gait belt Activity Tolerance: Patient tolerated treatment well Patient left: in bed;with call bell/phone within reach;with family/visitor present;with bed alarm set Nurse Communication: Mobility status PT Visit Diagnosis: Difficulty in walking, not elsewhere classified (R26.2)    Time: 0940-1005 PT Time Calculation (min) (ACUTE ONLY): 25 min   Charges:   PT Evaluation $PT Eval Moderate Complexity: 1 Mod PT Treatments $Gait Training: 8-22 mins        Ezell Poke H. Owens Shark, PT, DPT, NCS 09/12/18, 11:18 AM [phone]

## 2018-09-12 NOTE — Progress Notes (Signed)
Went over discharge instructions with the patient and wife including medications and follow-up appointment. Discontinue peripheral IV and telemetry monitor. Volunteer took patient for transport.

## 2018-09-13 LAB — ECHOCARDIOGRAM COMPLETE
Height: 65 in
Weight: 2640 oz

## 2018-09-30 ENCOUNTER — Ambulatory Visit: Payer: Medicare Other | Admitting: Podiatry

## 2019-12-09 ENCOUNTER — Other Ambulatory Visit: Payer: Self-pay

## 2019-12-09 ENCOUNTER — Encounter: Payer: Self-pay | Admitting: Podiatry

## 2019-12-09 ENCOUNTER — Ambulatory Visit: Payer: Medicare Other | Admitting: Podiatry

## 2019-12-09 DIAGNOSIS — M79674 Pain in right toe(s): Secondary | ICD-10-CM | POA: Diagnosis not present

## 2019-12-09 DIAGNOSIS — M79675 Pain in left toe(s): Secondary | ICD-10-CM | POA: Diagnosis not present

## 2019-12-09 DIAGNOSIS — L989 Disorder of the skin and subcutaneous tissue, unspecified: Secondary | ICD-10-CM

## 2019-12-09 DIAGNOSIS — B351 Tinea unguium: Secondary | ICD-10-CM

## 2019-12-09 DIAGNOSIS — E0843 Diabetes mellitus due to underlying condition with diabetic autonomic (poly)neuropathy: Secondary | ICD-10-CM

## 2019-12-09 NOTE — Progress Notes (Signed)
   SUBJECTIVE Patient with a history of diabetes mellitus presents to office today complaining of elongated, thickened nails that cause pain while ambulating in shoes.  He is unable to trim his own nails. Patient is here for further evaluation and treatment.   Past Medical History:  Diagnosis Date  . Dementia ([place])   . Diabetes mellitus without complication ([place])   . Hyperlipidemia   . Hypertension   . Renal disorder     OBJECTIVE General Patient is awake, alert, and oriented x 3 and in no acute distress. Derm Skin is dry and supple bilateral. Negative open lesions or macerations. Remaining integument unremarkable. Nails are tender, long, thickened and dystrophic with subungual debris, consistent with onychomycosis, 1-5 bilateral. No signs of infection noted.  Hyperkeratotic callus tissue also noted to the bilateral feet Vasc  DP and PT pedal pulses palpable bilaterally. Temperature gradient within normal limits.  Neuro Epicritic and protective threshold sensation diminished bilaterally.  Musculoskeletal Exam No symptomatic pedal deformities noted bilateral. Muscular strength within normal limits.  ASSESSMENT 1. Diabetes Mellitus w/ peripheral neuropathy 2. Onychomycosis of nail due to dermatophyte bilateral 3.  Preulcerative callus lesions bilateral feet  4.  Pain in foot bilateral  PLAN OF CARE 1. Patient evaluated today. 2. Instructed to maintain good pedal hygiene and foot care. Stressed importance of controlling blood sugar.  3. Mechanical debridement of nails 1-5 bilaterally performed using a nail nipper. Filed with dremel without incident.  4.  Excisional debridement of the symptomatic callus lesions was performed using a tissue nipper without incident or bleeding  5.  Return to clinic in 3 mos.     Edrick Kins, DPM [facility]  Dr. Edrick Kins, [address]                                        [place], [place] [address]                  Office [phone]  Fax [phone]

## 2019-12-30 ENCOUNTER — Emergency Department: Payer: Medicare Other

## 2019-12-30 ENCOUNTER — Encounter: Payer: Self-pay | Admitting: Emergency Medicine

## 2019-12-30 ENCOUNTER — Other Ambulatory Visit: Payer: Self-pay

## 2019-12-30 ENCOUNTER — Emergency Department
Admission: EM | Admit: 2019-12-30 | Discharge: 2019-12-30 | Disposition: A | Discharge status: Home / Self Care | Payer: Medicare Other | Attending: Emergency Medicine | Admitting: Emergency Medicine

## 2019-12-30 DIAGNOSIS — F0390 Unspecified dementia without behavioral disturbance: Secondary | ICD-10-CM | POA: Insufficient documentation

## 2019-12-30 DIAGNOSIS — I129 Hypertensive chronic kidney disease with stage 1 through stage 4 chronic kidney disease, or unspecified chronic kidney disease: Secondary | ICD-10-CM | POA: Insufficient documentation

## 2019-12-30 DIAGNOSIS — Z794 Long term (current) use of insulin: Secondary | ICD-10-CM | POA: Insufficient documentation

## 2019-12-30 DIAGNOSIS — Z87891 Personal history of nicotine dependence: Secondary | ICD-10-CM | POA: Diagnosis not present

## 2019-12-30 DIAGNOSIS — N184 Chronic kidney disease, stage 4 (severe): Secondary | ICD-10-CM | POA: Insufficient documentation

## 2019-12-30 DIAGNOSIS — Z7982 Long term (current) use of aspirin: Secondary | ICD-10-CM | POA: Insufficient documentation

## 2019-12-30 DIAGNOSIS — E119 Type 2 diabetes mellitus without complications: Secondary | ICD-10-CM

## 2019-12-30 DIAGNOSIS — R079 Chest pain, unspecified: Secondary | ICD-10-CM | POA: Diagnosis present

## 2019-12-30 DIAGNOSIS — E1122 Type 2 diabetes mellitus with diabetic chronic kidney disease: Secondary | ICD-10-CM | POA: Insufficient documentation

## 2019-12-30 LAB — BASIC METABOLIC PANEL
Anion gap: 7 (ref 5–15)
BUN: 49 mg/dL — ABNORMAL HIGH (ref 8–23)
CO2: 21 mmol/L — ABNORMAL LOW (ref 22–32)
Calcium: 9.2 mg/dL (ref 8.9–10.3)
Chloride: 109 mmol/L (ref 98–111)
Creatinine, Ser: 2.46 mg/dL — ABNORMAL HIGH (ref 0.61–1.24)
GFR calc Af Amer: 27 mL/min — ABNORMAL LOW (ref >60)
GFR calc non Af Amer: 23 mL/min — ABNORMAL LOW (ref >60)
Glucose, Bld: 134 mg/dL — ABNORMAL HIGH (ref 70–99)
Potassium: 3.7 mmol/L (ref 3.5–5.1)
Sodium: 137 mmol/L (ref 135–145)

## 2019-12-30 LAB — CBC
HCT: 36.7 % — ABNORMAL LOW (ref 39.0–52.0)
Hemoglobin: 12.2 g/dL — ABNORMAL LOW (ref 13.0–17.0)
MCH: 27.5 pg (ref 26.0–34.0)
MCHC: 33.2 g/dL (ref 30.0–36.0)
MCV: 82.8 fL (ref 80.0–100.0)
Platelets: 297 10*3/uL (ref 150–400)
RBC: 4.43 MIL/uL (ref 4.22–5.81)
RDW: 13.9 % (ref 11.5–15.5)
WBC: 6.8 10*3/uL (ref 4.0–10.5)
nRBC: 0 % (ref 0.0–0.2)

## 2019-12-30 LAB — TROPONIN I (HIGH SENSITIVITY)
Troponin I (High Sensitivity): 14 ng/L (ref <18)
Troponin I (High Sensitivity): 13 ng/L (ref <18)

## 2019-12-30 NOTE — ED Triage Notes (Signed)
Pt wife reports about an hour ago her husband made a noise and she asked him what was wrong and he said his chest hurt. Pt denies pain and reports he never had pain. Pt denies all sx's.

## 2019-12-30 NOTE — ED Provider Notes (Signed)
[facility] Emergency Department Provider Note  ____________________________________________  Time seen: Approximately 7:11 PM  I have reviewed the triage vital signs and the nursing notes.   HISTORY  Chief Complaint Chest Pain    HPI Shawn May is a 86 y.o. male with a history of hypertension diabetes and dementia who was in his usual state of health until about 2:00 PM when he reported to his wife that he was having chest pain on the left side.  By the time he arrived in the ED at 3:00 PM he denied any pain, and he currently denies any pain.  Nonradiating, no shortness of breath vomiting diaphoresis or other associated symptoms, no aggravating or alleviating factors, not pleuritic.  Currently the patient states that he feels fine in his usual state of health.      Past Medical History:  Diagnosis Date  . Dementia ([place])   . Diabetes mellitus without complication ([place])   . Hyperlipidemia   . Hypertension   . Renal disorder      Patient Active Problem List   Diagnosis Date Noted  . CVA (cerebral vascular accident) ([place]) 09/11/2018     History reviewed. No pertinent surgical history.   Prior to Admission medications   Medication Sig Start Date End Date Taking? Authorizing Provider  aspirin 81 MG chewable tablet Chew 1 tablet (81 mg total) by mouth daily. 09/13/18   Demetrios Loll, MD  atorvastatin (LIPITOR) 40 MG tablet Take 40 mg by mouth daily.    [provider]  cholecalciferol (VITAMIN D3) 25 MCG (1000 UT) tablet Take 2,000 Units by mouth daily.    [provider]  donepezil (ARICEPT) 5 MG tablet Take 5 mg by mouth at bedtime.    [provider]  glipiZIDE (GLUCOTROL XL) 10 MG 24 hr tablet Take 10 mg by mouth 2 (two) times daily.    [provider]  hydrochlorothiazide (HYDRODIURIL) 25 MG tablet Take 25 mg by mouth daily.    [provider]  linagliptin (TRADJENTA) 5 MG TABS tablet Take 5 mg by  mouth daily.    [provider]  memantine (NAMENDA) 5 MG tablet Take 5 mg by mouth 2 (two) times daily.  09/10/18   [provider]  quinapril (ACCUPRIL) 10 MG tablet Take 10 mg by mouth daily.    [provider]  sodium bicarbonate 650 MG tablet Take 650 mg by mouth 2 (two) times daily. 08/13/18   [provider]     Allergies Patient has no known allergies.   No family history on file.  Social History Social History   Tobacco Use  . Smoking status: Former Research scientist (life sciences)  . Smokeless tobacco: Never Used  Substance Use Topics  . Alcohol use: Not Currently  . Drug use: Not Currently    Review of Systems  Constitutional:   No fever or chills.  ENT:   No sore throat. No rhinorrhea. Cardiovascular: Positive chest pain as above without syncope. Respiratory:   No dyspnea or cough. Gastrointestinal:   Negative for abdominal pain, vomiting and diarrhea.  Musculoskeletal:   Negative for focal pain or swelling All other systems reviewed and are negative except as documented above in ROS and HPI.  ____________________________________________   PHYSICAL EXAM:  VITAL SIGNS: ED Triage Vitals  Enc Vitals Group     BP 12/30/19 1518 103/70     Pulse Rate 12/30/19 1518 (!) 103     Resp 12/30/19 1518 18  Temp 12/30/19 1518 98.5 F (36.9 C)     Temp Source 12/30/19 1518 Oral     SpO2 12/30/19 1518 100 %     Weight 12/30/19 1516 165 lb (74.8 kg)     Height 12/30/19 1516 5\' 5"  (1.651 m)     Head Circumference --      Peak Flow --      Pain Score 12/30/19 1516 0     Pain Loc --      Pain Edu? --      Excl. in [place]? --     Vital signs reviewed, nursing assessments reviewed.   Constitutional:   Alert and oriented. Non-toxic appearance. Eyes:   Conjunctivae are normal. EOMI. PERRL. ENT      Head:   Normocephalic and atraumatic.      Nose:   Wearing a mask.      Mouth/Throat:   Wearing a mask.      Neck:   No meningismus. Full  ROM. Hematological/Lymphatic/Immunilogical:   No cervical lymphadenopathy. Cardiovascular:   RRR. Symmetric bilateral radial and DP pulses.  No murmurs. Cap refill less than 2 seconds. Respiratory:   Normal respiratory effort without tachypnea/retractions. Breath sounds are clear and equal bilaterally. No wheezes/rales/rhonchi. Gastrointestinal:   Soft and nontender. Non distended. There is no CVA tenderness.  No rebound, rigidity, or guarding.  Musculoskeletal:   Normal range of motion in all extremities. No joint effusions.  No lower extremity tenderness.  No edema.  Chest wall nontender Neurologic:   Normal speech and language.  Motor grossly intact. No acute focal neurologic deficits are appreciated.  Skin:    Skin is warm, dry and intact. No rash noted.  No petechiae, purpura, or bullae.  ____________________________________________    LABS (pertinent positives/negatives) (all labs ordered are listed, but only abnormal results are displayed) Labs Reviewed  BASIC METABOLIC PANEL - Abnormal; Notable for the following components:      Result Value   CO2 21 (*)    Glucose, Bld 134 (*)    BUN 49 (*)    Creatinine, Ser 2.46 (*)    GFR calc non Af Amer 23 (*)    GFR calc Af Amer 27 (*)    All other components within normal limits  CBC - Abnormal; Notable for the following components:   Hemoglobin 12.2 (*)    HCT 36.7 (*)    All other components within normal limits  TROPONIN I (HIGH SENSITIVITY)  TROPONIN I (HIGH SENSITIVITY)   ____________________________________________   EKG  Interpreted by me  Date: 12/30/2019  Rate: 88  Rhythm: normal sinus rhythm  QRS Axis: normal  Intervals: normal  ST/T Wave abnormalities: normal  Conduction Disutrbances: none  Narrative Interpretation: unremarkable      ____________________________________________    RADIOLOGY  DG Chest 2 View  Result Date: 12/30/2019 CLINICAL DATA:  Chest pain EXAM: CHEST - 2 VIEW COMPARISON:   None. FINDINGS: Lungs are clear. Heart size and pulmonary vascularity are normal. No adenopathy. There is mild degenerative change in the thoracic spine. There is degenerative change in each shoulder. No pneumothorax. IMPRESSION: No edema or airspace opacity. Cardiac silhouette within normal limits. Electronically Signed   By: Lowella Grip III M.D.   On: 12/30/2019 15:49    ____________________________________________   PROCEDURES Procedures  ____________________________________________  DIFFERENTIAL DIAGNOSIS   Musculoskeletal pain, intestinal gas pain, non-STEMI, GERD  CLINICAL IMPRESSION / ASSESSMENT AND PLAN / ED COURSE  Medications ordered in the ED: Medications - No data  to display  Pertinent labs & imaging results that were available during my care of the patient were reviewed by me and considered in my medical decision making (see chart for details).  Shawn May was evaluated in Emergency Department on 12/30/2019 for the symptoms described in the history of present illness. He was evaluated in the context of the global COVID-19 pandemic, which necessitated consideration that the patient might be at risk for infection with the SARS-CoV-2 virus that causes COVID-19. Institutional protocols and algorithms that pertain to the evaluation of patients at risk for COVID-19 are in a state of rapid change based on information released by regulatory bodies including the CDC and federal and state organizations. These policies and algorithms were followed during the patient's care in the ED.   Patient presents with nonspecific chest pain episode lasting up to 1 hour without any worrisome features.  Is currently asymptomatic on arrival to the ED.  Vital signs unremarkable, EKG is normal.  Initial chest x-ray and labs are all reassuring, baseline CKD.  We will recheck his troponin, if delta is flat, he will be stable for outpatient follow-up with primary  care.   ----------------------------------------- 8:01 PM on 12/30/2019 -----------------------------------------  Repeat troponin unchanged.  Tolerating oral intake, vital signs normal.  Stable for discharge.     ____________________________________________   FINAL CLINICAL IMPRESSION(S) / ED DIAGNOSES    Final diagnoses:  Nonspecific chest pain  Type 2 diabetes mellitus without complication, without long-term current use of insulin (HCC)  Stage 4 chronic kidney disease ([place])  Chronic dementia without behavioral disturbance [facility])     ED Discharge Orders    None      Portions of this note were generated with dragon dictation software. Dictation errors may occur despite best attempts at proofreading.   Carrie Mew, MD 12/30/19 2001

## 2019-12-30 NOTE — ED Notes (Signed)
Pt provided w/ [place] sandwich and ginger ale per request and MD permission.

## 2020-02-29 IMAGING — CT CT HEAD WITHOUT CONTRAST
3 series · 15 of 46 positions shown, 18 images · non-contrast
Comparison: None.

CLINICAL DATA: Altered level of consciousness. Dizziness. Change in
behavior.

EXAM:
CT HEAD WITHOUT CONTRAST
TECHNIQUE: Contiguous axial images were obtained from the base of the skull
through the vertex without intravenous contrast.

[Series 3: head wo · axial · 0.41mm/px · z∈[-101,+18]mm · 9 of 29 slices shown, 12 images]
[im 3/29  brain]
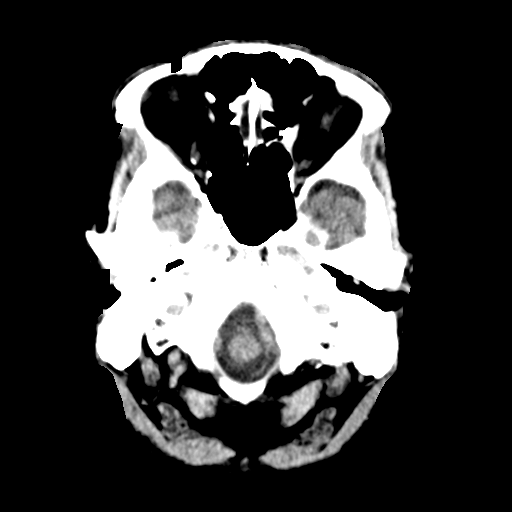
[im 3/29  bone]
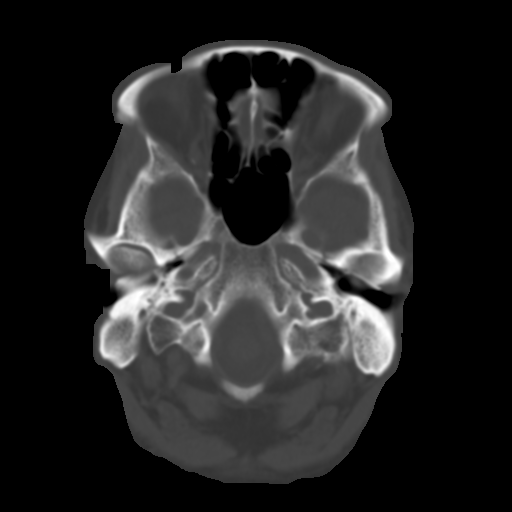
[im 6/29  brain]
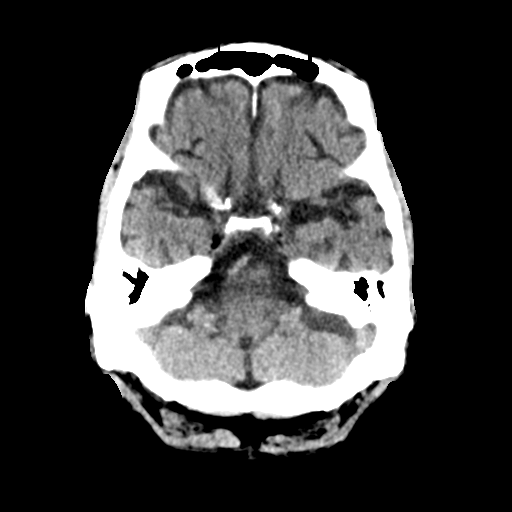
[im 9/29  brain]
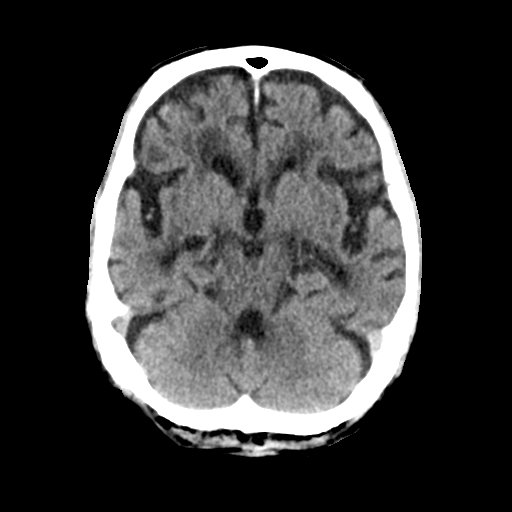
[im 12/29  brain]
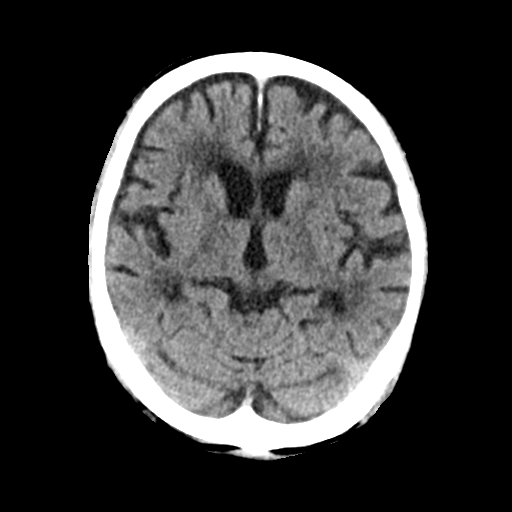
[im 15/29  brain]
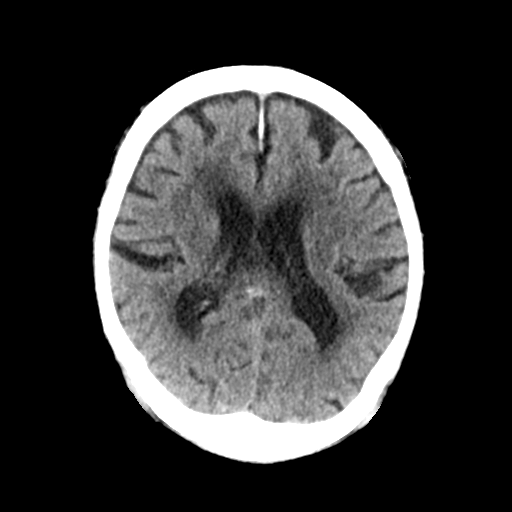
[im 15/29  bone]
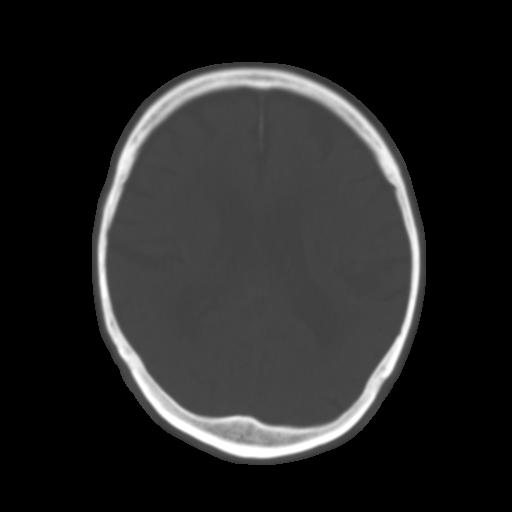
[im 18/29  brain]
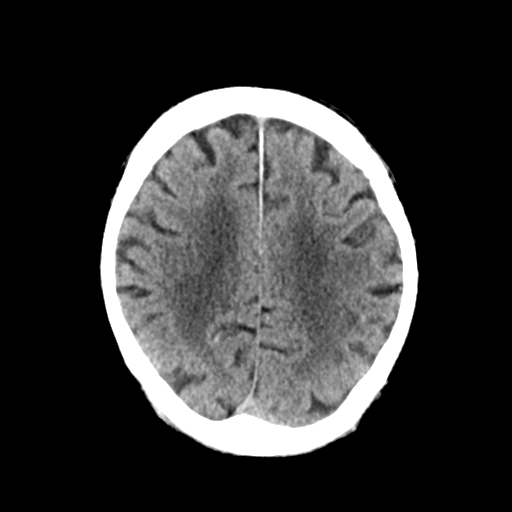
[im 21/29  brain]
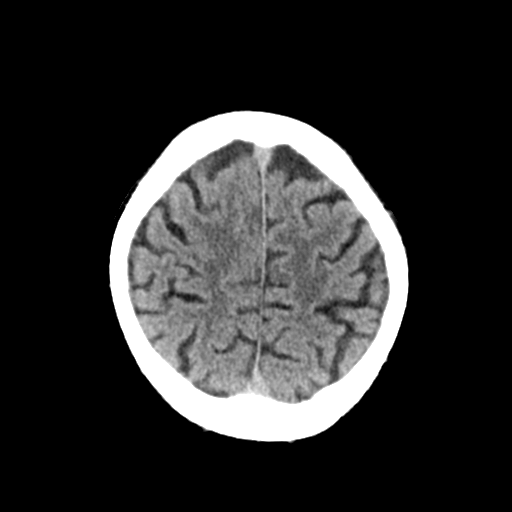
[im 24/29  brain]
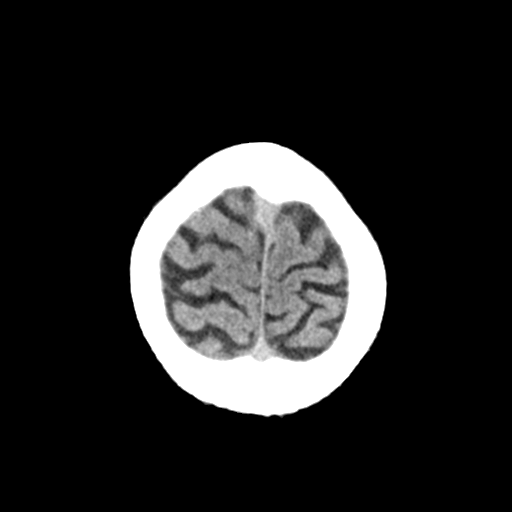
[im 27/29  brain]
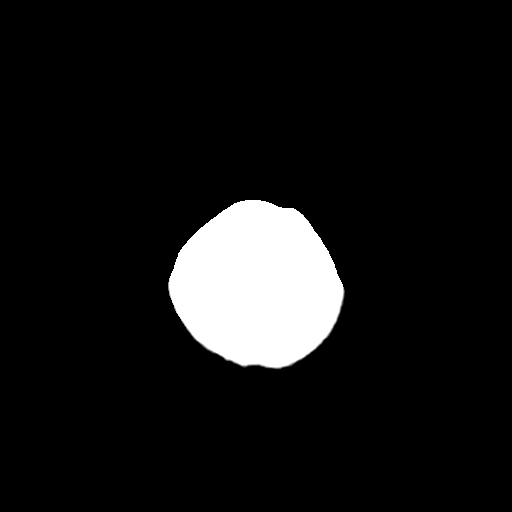
[im 27/29  bone]
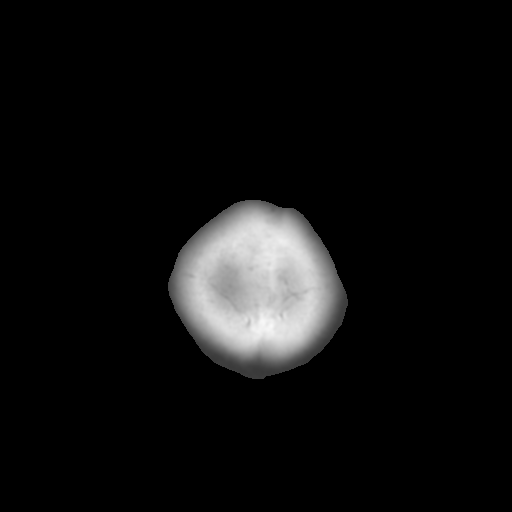

[Series 4: coronal soft tissue · coronal · 0.33mm/px · 3 of 65 slices shown]
[im 22/65  brain]
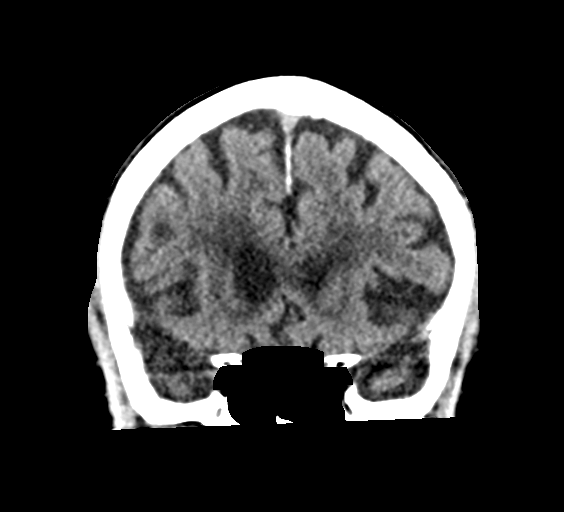
[im 29/65  brain]
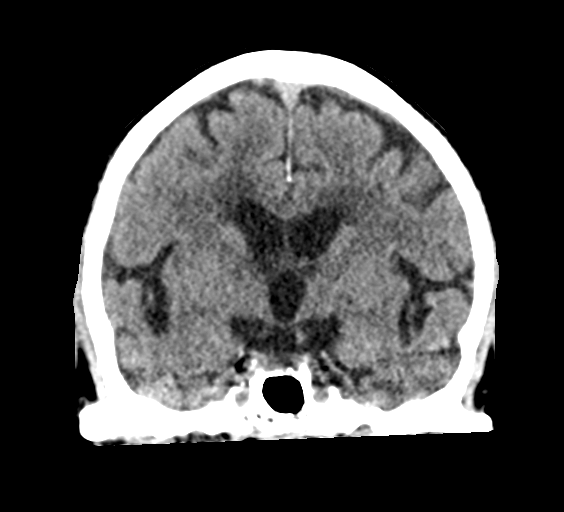
[im 36/65  brain]
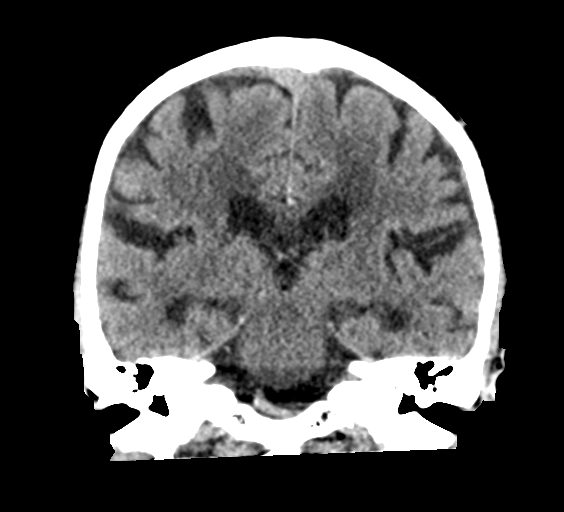

[Series 5: sagittal soft tissue · sagittal · 0.34mm/px · 3 of 54 slices shown]
[im 18/54  brain]
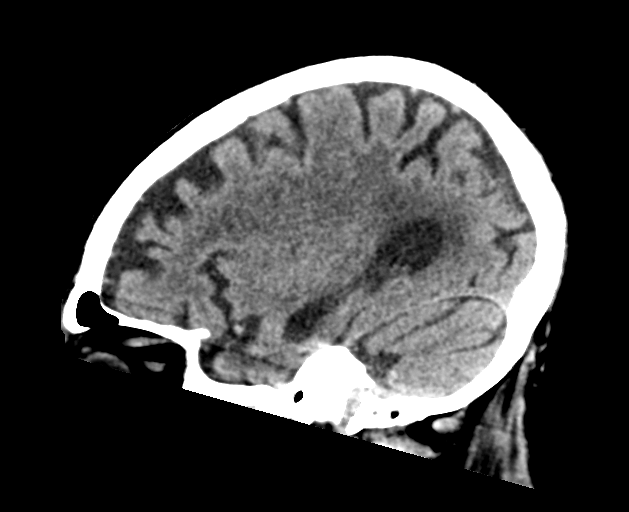
[im 27/54  brain]
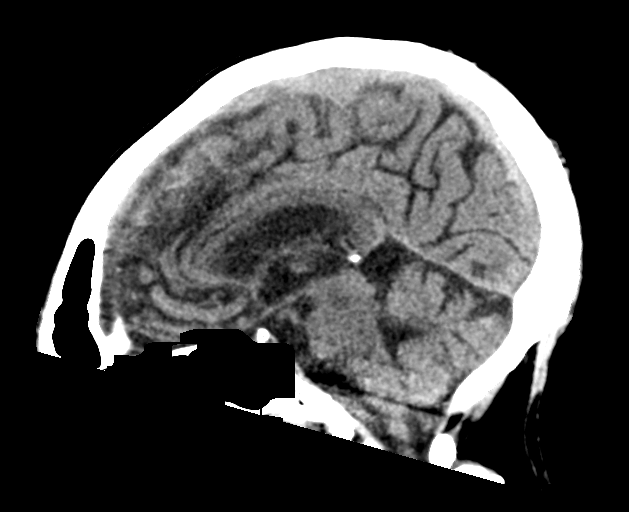
[im 36/54  brain]
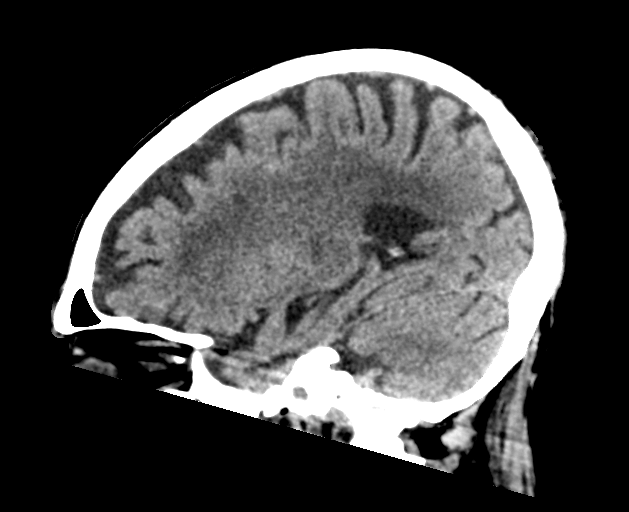

[15 of 46 positions shown; findings below may reference images not displayed]

FINDINGS: Brain: There is a focal area of low attenuation within the left side
of brainstem, image number [DATE] compatible with either a subacute or
chronic lacunar infarct. No findings identified to suggest cortical
infarct, or intracranial hemorrhage. Chronic bilateral subdural
hygromas overlie the frontal lobes. No mass effect. On the right
this measures 6 mm in thickness. On the left this measures 8 mm in
thickness. There is marked diffuse low-attenuation within the
subcortical and periventricular white matter compatible with chronic
microvascular disease. Prominence of the sulci and ventricles
compatible with brain atrophy

Vascular: No hyperdense vessel or unexpected calcification.

Skull: Normal. Negative for fracture or focal lesion.

Sinuses/Orbits: The paranasal sinuses and mastoid air cells are
clear.

Other: None.
IMPRESSION: 1. Advanced chronic small vessel ischemic disease and brain atrophy.
Within the left side of brainstem there is an age indeterminate,
either subacute or chronic, lacunar infarct measuring approximately
8 mm.
2. Chronic bilateral subdural hygromas overlie the frontal lobes.

## 2020-02-29 IMAGING — MR MRA HEAD WITHOUT CONTRAST
10 of 11 series · 34 of 48 positions shown · non-contrast
Comparison: Head CT 09/11/2018

CLINICAL DATA: Stroke follow-up

EXAM:
MRI HEAD WITHOUT CONTRAST
MRA HEAD WITHOUT CONTRAST
TECHNIQUE: Multiplanar, multiecho pulse sequences of the brain and surrounding
structures were obtained without intravenous contrast. Angiographic
images of the head were obtained using MRA technique without
contrast.

[Series 2: ax dwi_tracew · axial · 3.0mm · 0.83mm/px · z∈[-61,+92]mm · 4 of 52 slices shown]
[im 1/52]
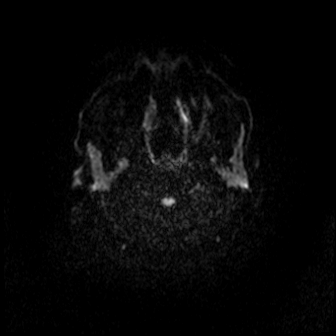
[im 18/52]
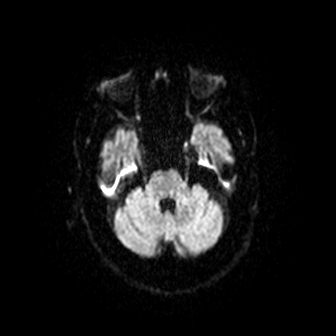
[im 35/52]
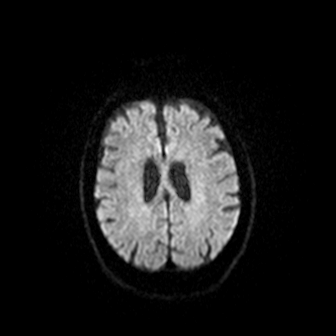
[im 52/52]
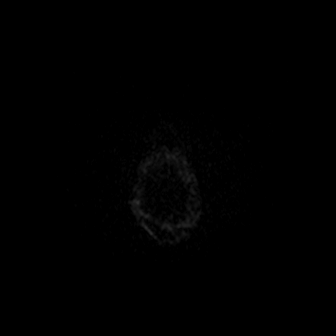

[Series 3: ax dwi_adc · axial · 3.0mm · 0.83mm/px · z∈[-61,+92]mm · 5 of 52 slices shown]
[im 1/52]
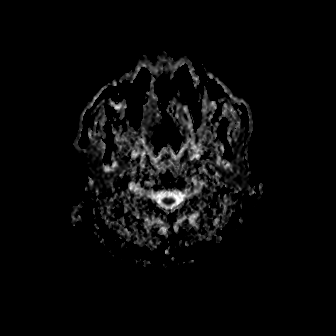
[im 13/52]
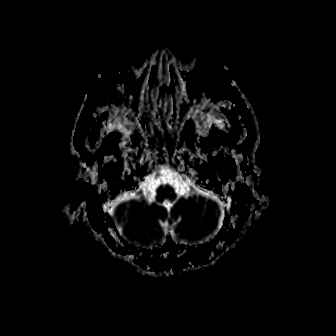
[im 26/52]
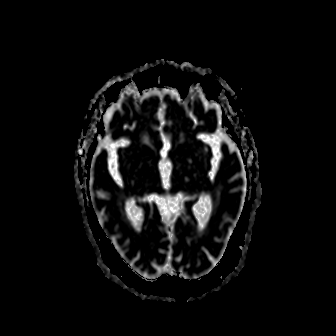
[im 39/52]
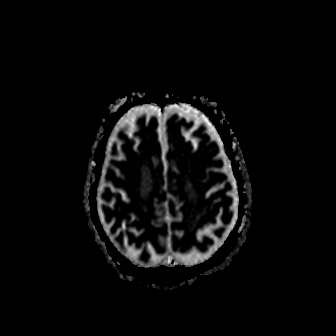
[im 52/52]
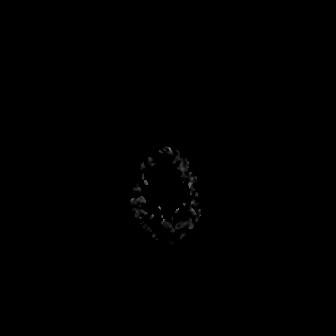

[Series 4: cor dwi_tracew · coronal · 5.0mm · 0.68mm/px · 4 of 36 slices shown]
[im 1/36]
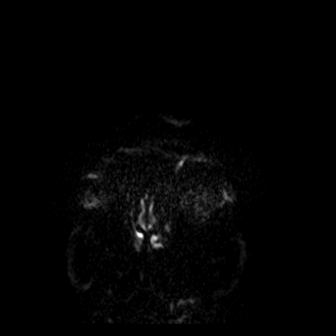
[im 12/36]
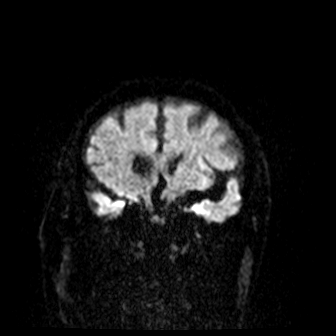
[im 24/36]
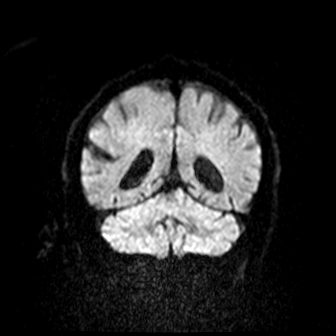
[im 36/36]
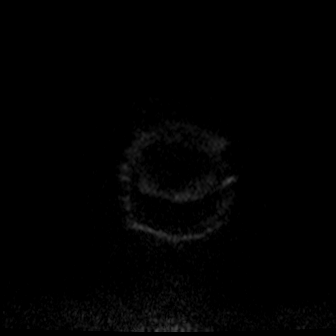

[Series 5: cor dwi_adc · coronal · 5.0mm · 0.68mm/px · 4 of 36 slices shown]
[im 1/36]
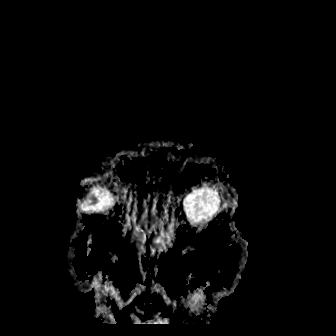
[im 12/36]
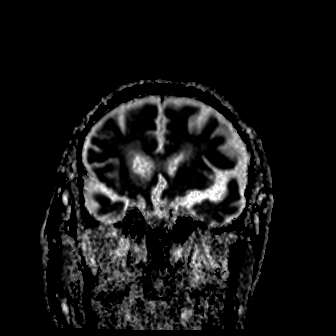
[im 24/36]
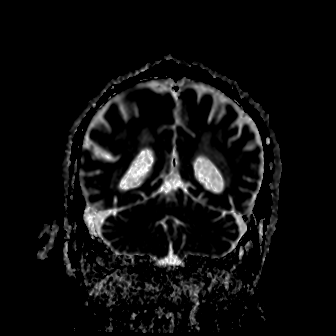
[im 36/36]
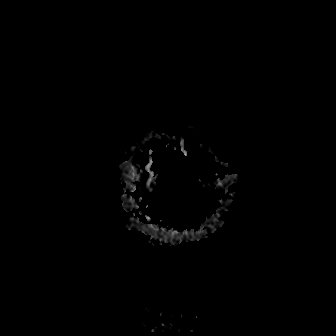

[Series 11: T1 · sagittal · 5.0mm · 0.94mm/px · 2 of 21 slices shown (1 of 2)]
[im 1/21]
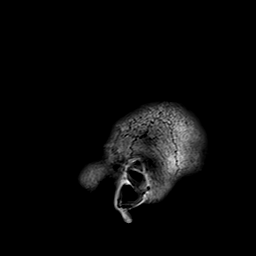
[im 21/21]
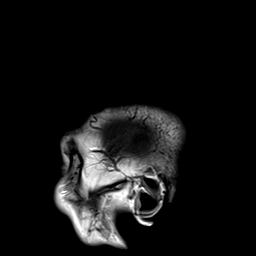

[Series 12: T2 · axial · 5.0mm · 0.45mm/px · z∈[-61,+95]mm · 3 of 27 slices shown (1 of 2)]
[im 1/27]
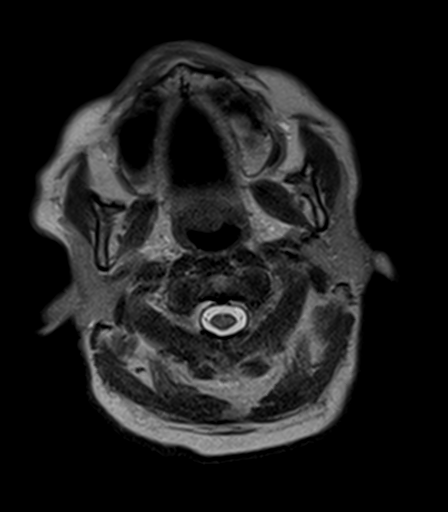
[im 14/27]
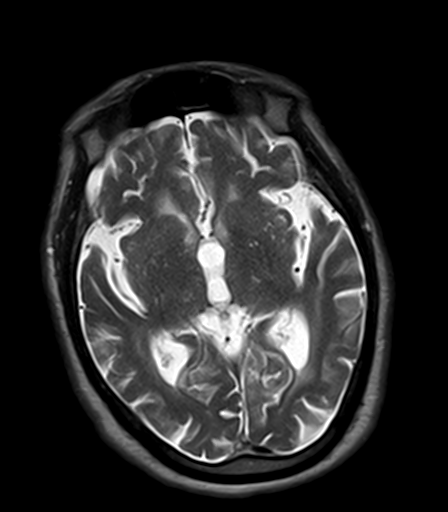
[im 27/27]
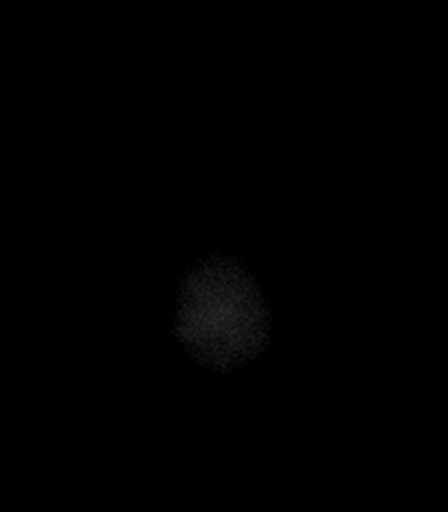

[Series 13: T2-star · axial · 5.0mm · 0.45mm/px · z∈[-61,+95]mm · 3 of 27 slices shown]
[im 1/27]
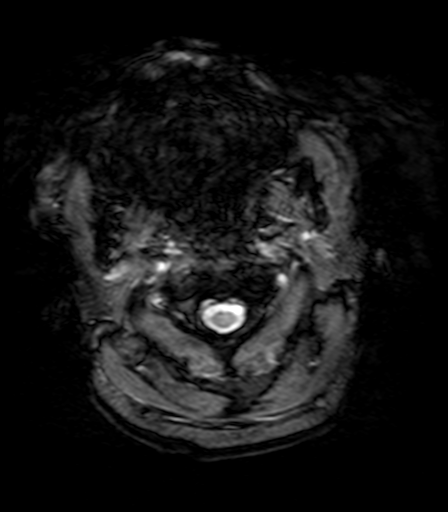
[im 14/27]
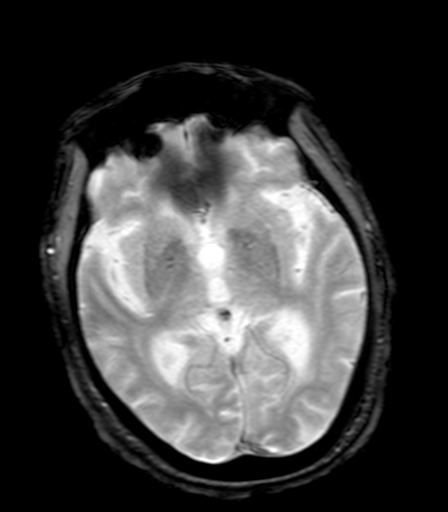
[im 27/27]
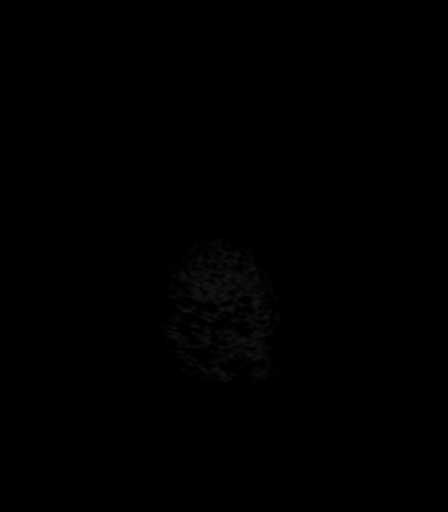

[Series 14: FLAIR · axial · 5.0mm · 1.20mm/px · z∈[-61,+95]mm · 3 of 27 slices shown]
[im 1/27]
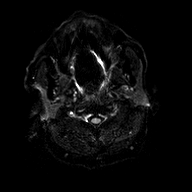
[im 14/27]
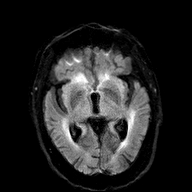
[im 27/27]
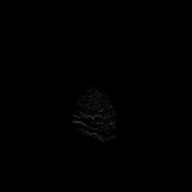

[Series 15: T1 · axial · 5.0mm · 0.90mm/px · z∈[-60,+95]mm · 3 of 27 slices shown (2 of 2)]
[im 1/27]
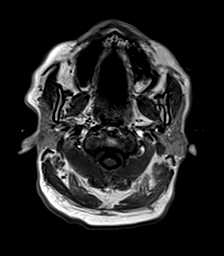
[im 14/27]
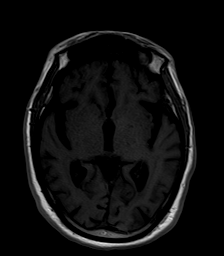
[im 27/27]
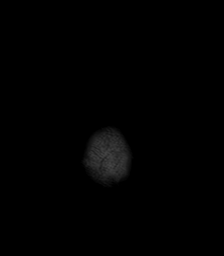

[Series 16: T2 · coronal · 5.0mm · 0.45mm/px · 3 of 30 slices shown (2 of 2)]
[im 1/30]
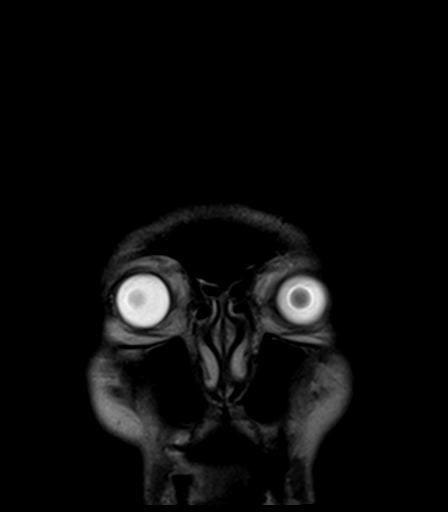
[im 15/30]
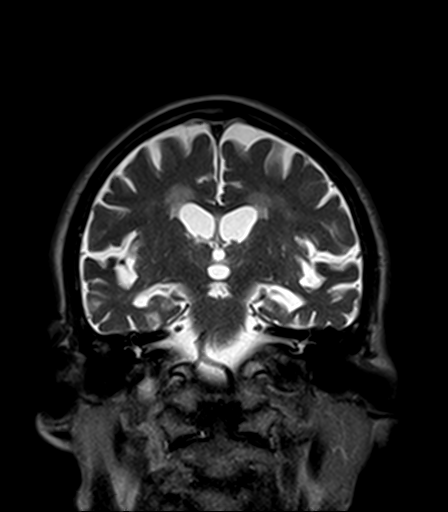
[im 30/30]
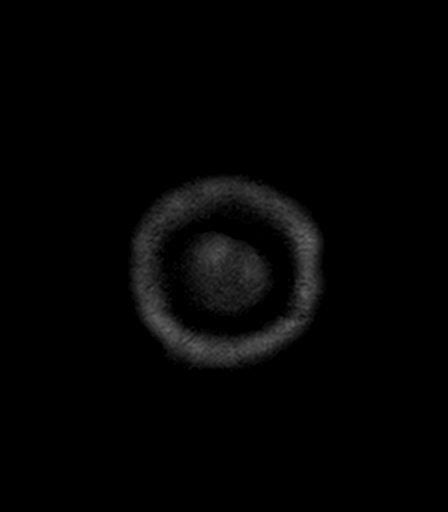

[34 of 48 positions shown; findings below may reference images not displayed]

FINDINGS: MRI HEAD FINDINGS

BRAIN: There is no acute infarct, acute hemorrhage or mass effect.
The midline structures are normal. There are multiple old, punctate
infarcts of the cerebellum and basal ganglia. Diffuse confluent
hyperintense T2-weighted signal within the periventricular, deep and
juxtacortical white matter, most commonly due to chronic ischemic
microangiopathy. Generalized atrophy without lobar predilection.
Susceptibility-sensitive sequences show no chronic microhemorrhage
or superficial siderosis.

SKULL AND UPPER CERVICAL SPINE: The visualized skull base,
calvarium, upper cervical spine and extracranial soft tissues are
normal.

SINUSES/ORBITS: No fluid levels or advanced mucosal thickening. No
mastoid or middle ear effusion. The orbits are normal.

MRA HEAD FINDINGS

POSTERIOR CIRCULATION:

--Basilar artery: Normal.

--Posterior cerebral arteries: There is severe stenosis of the
proximal right P2 segment. Moderate diffuse narrowing of the left P2
segment.

--Superior cerebellar arteries: Normal.

--Inferior cerebellar arteries: Normal right anterior and bilateral
posterior inferior cerebellar arteries. Left AICA not clearly
visualized.

ANTERIOR CIRCULATION:

--Intracranial internal carotid arteries: Normal.

--Anterior cerebral arteries: Normal. Both A1 segments are present.
Patent anterior communicating artery.

--Middle cerebral arteries: Normal.

--Posterior communicating arteries: The left P-comm partially
supplies the left PCA. The right P-comm is absent or diminutive.
IMPRESSION: 1. No acute ischemia or hemorrhage.
2. Multifocal severe stenosis of the right PCA P2 segment.
3. Long segment mild-to-moderate stenosis of the left PCA P2
segment.

## 2020-03-11 ENCOUNTER — Other Ambulatory Visit: Payer: Self-pay

## 2020-03-11 ENCOUNTER — Encounter: Payer: Self-pay | Admitting: Podiatry

## 2020-03-11 ENCOUNTER — Ambulatory Visit: Payer: Medicare Other | Admitting: Podiatry

## 2020-03-11 DIAGNOSIS — M79674 Pain in right toe(s): Secondary | ICD-10-CM

## 2020-03-11 DIAGNOSIS — E0843 Diabetes mellitus due to underlying condition with diabetic autonomic (poly)neuropathy: Secondary | ICD-10-CM | POA: Diagnosis not present

## 2020-03-11 DIAGNOSIS — B351 Tinea unguium: Secondary | ICD-10-CM

## 2020-03-11 DIAGNOSIS — M79675 Pain in left toe(s): Secondary | ICD-10-CM | POA: Diagnosis not present

## 2020-03-11 NOTE — Progress Notes (Signed)
This patient returns to my office for at risk foot care.  This patient requires this care by a professional since this patient will be at risk due to having diabetes.  This patient is unable to cut nails himself since the patient cannot reach his nails.These nails are painful walking and wearing shoes. He presents with a male caregiver.  This patient presents for at risk foot care today.  General Appearance  Alert, conversant and in no acute stress.  Vascular  Dorsalis pedis and posterior tibial  pulses are weaklypalpable  bilaterally.  Capillary return is within normal limits  bilaterally. Temperature is within normal limits  bilaterally.  Neurologic  Senn-Weinstein monofilament wire test diminished bilaterally. Muscle power within normal limits bilaterally.  Nails Thick disfigured discolored nails with subungual debris  from hallux to fifth toes bilaterally. No evidence of bacterial infection or drainage bilaterally.  Orthopedic  No limitations of motion  feet .  No crepitus or effusions noted.  No bony pathology or digital deformities noted.  Skin  normotropic skin with no porokeratosis noted bilaterally.  No signs of infections or ulcers noted.     Onychomycosis  Pain in right toes  Pain in left toes  Consent was obtained for treatment procedures.   Mechanical debridement of nails 1-5  bilaterally performed with a nail nipper.  Filed with dremel without incident.    Return office visit    3 months                  Told patient to return for periodic foot care and evaluation due to potential at risk complications.   Gardiner Barefoot DPM

## 2020-06-10 ENCOUNTER — Encounter: Payer: Self-pay | Admitting: Podiatry

## 2020-06-10 ENCOUNTER — Other Ambulatory Visit: Payer: Self-pay

## 2020-06-10 ENCOUNTER — Ambulatory Visit: Payer: Medicare Other | Admitting: Podiatry

## 2020-06-10 DIAGNOSIS — M79674 Pain in right toe(s): Secondary | ICD-10-CM | POA: Diagnosis not present

## 2020-06-10 DIAGNOSIS — M79675 Pain in left toe(s): Secondary | ICD-10-CM | POA: Diagnosis not present

## 2020-06-10 DIAGNOSIS — B351 Tinea unguium: Secondary | ICD-10-CM

## 2020-06-10 DIAGNOSIS — E0843 Diabetes mellitus due to underlying condition with diabetic autonomic (poly)neuropathy: Secondary | ICD-10-CM

## 2020-06-10 NOTE — Progress Notes (Signed)
This patient returns to my office for at risk foot care.  This patient requires this care by a professional since this patient will be at risk due to having diabetes.  This patient is unable to cut nails himself since the patient cannot reach his nails.These nails are painful walking and wearing shoes. He presents with a male caregiver.  This patient presents for at risk foot care today.  General Appearance  Alert, conversant and in no acute stress.  Vascular  Dorsalis pedis and posterior tibial  pulses are weaklypalpable  bilaterally.  Capillary return is within normal limits  bilaterally. Temperature is within normal limits  Bilaterally.  Absent digital hair.  B/L.  Neurologic  Senn-Weinstein monofilament wire test diminished bilaterally. Muscle power within normal limits bilaterally.  Nails Thick disfigured discolored nails with subungual debris  from hallux to fifth toes bilaterally. No evidence of bacterial infection or drainage bilaterally.  Orthopedic  No limitations of motion  feet .  No crepitus or effusions noted.  No bony pathology or digital deformities noted.  Skin  normotropic skin with no porokeratosis noted bilaterally.  No signs of infections or ulcers noted.     Onychomycosis  Pain in right toes  Pain in left toes  Consent was obtained for treatment procedures.   Mechanical debridement of nails 1-5  bilaterally performed with a nail nipper.  Filed with dremel without incident.    Return office visit    3 months                  Told patient to return for periodic foot care and evaluation due to potential at risk complications.   Gardiner Barefoot DPM

## 2020-07-22 ENCOUNTER — Encounter: Payer: Self-pay | Admitting: Emergency Medicine

## 2020-07-22 ENCOUNTER — Inpatient Hospital Stay: Payer: Medicare Other

## 2020-07-22 ENCOUNTER — Other Ambulatory Visit: Payer: Self-pay

## 2020-07-22 ENCOUNTER — Inpatient Hospital Stay
Admission: EM | Admit: 2020-07-22 | Discharge: 2020-08-31 | DRG: 871 | Disposition: E | Payer: Medicare Other | Attending: Internal Medicine | Admitting: Internal Medicine

## 2020-07-22 ENCOUNTER — Emergency Department: Payer: Medicare Other

## 2020-07-22 DIAGNOSIS — Z515 Encounter for palliative care: Secondary | ICD-10-CM | POA: Diagnosis not present

## 2020-07-22 DIAGNOSIS — R1310 Dysphagia, unspecified: Secondary | ICD-10-CM | POA: Diagnosis present

## 2020-07-22 DIAGNOSIS — Z66 Do not resuscitate: Secondary | ICD-10-CM | POA: Diagnosis present

## 2020-07-22 DIAGNOSIS — N179 Acute kidney failure, unspecified: Secondary | ICD-10-CM | POA: Diagnosis present

## 2020-07-22 DIAGNOSIS — E1122 Type 2 diabetes mellitus with diabetic chronic kidney disease: Secondary | ICD-10-CM | POA: Diagnosis present

## 2020-07-22 DIAGNOSIS — Z681 Body mass index (BMI) 19 or less, adult: Secondary | ICD-10-CM | POA: Diagnosis not present

## 2020-07-22 DIAGNOSIS — N184 Chronic kidney disease, stage 4 (severe): Secondary | ICD-10-CM | POA: Diagnosis present

## 2020-07-22 DIAGNOSIS — I129 Hypertensive chronic kidney disease with stage 1 through stage 4 chronic kidney disease, or unspecified chronic kidney disease: Secondary | ICD-10-CM | POA: Diagnosis present

## 2020-07-22 DIAGNOSIS — R531 Weakness: Secondary | ICD-10-CM

## 2020-07-22 DIAGNOSIS — U071 COVID-19: Secondary | ICD-10-CM | POA: Diagnosis present

## 2020-07-22 DIAGNOSIS — Z7189 Other specified counseling: Secondary | ICD-10-CM | POA: Diagnosis not present

## 2020-07-22 DIAGNOSIS — A419 Sepsis, unspecified organism: Secondary | ICD-10-CM | POA: Diagnosis present

## 2020-07-22 DIAGNOSIS — Z8673 Personal history of transient ischemic attack (TIA), and cerebral infarction without residual deficits: Secondary | ICD-10-CM

## 2020-07-22 DIAGNOSIS — R6520 Severe sepsis without septic shock: Secondary | ICD-10-CM | POA: Diagnosis present

## 2020-07-22 DIAGNOSIS — N2581 Secondary hyperparathyroidism of renal origin: Secondary | ICD-10-CM | POA: Diagnosis present

## 2020-07-22 DIAGNOSIS — E875 Hyperkalemia: Secondary | ICD-10-CM | POA: Diagnosis present

## 2020-07-22 DIAGNOSIS — N390 Urinary tract infection, site not specified: Secondary | ICD-10-CM | POA: Diagnosis present

## 2020-07-22 DIAGNOSIS — E11649 Type 2 diabetes mellitus with hypoglycemia without coma: Secondary | ICD-10-CM | POA: Diagnosis not present

## 2020-07-22 DIAGNOSIS — Z7984 Long term (current) use of oral hypoglycemic drugs: Secondary | ICD-10-CM | POA: Diagnosis not present

## 2020-07-22 DIAGNOSIS — N183 Chronic kidney disease, stage 3 unspecified: Secondary | ICD-10-CM | POA: Diagnosis present

## 2020-07-22 DIAGNOSIS — N170 Acute kidney failure with tubular necrosis: Secondary | ICD-10-CM | POA: Diagnosis present

## 2020-07-22 DIAGNOSIS — A4151 Sepsis due to Escherichia coli [E. coli]: Principal | ICD-10-CM | POA: Diagnosis present

## 2020-07-22 DIAGNOSIS — E869 Volume depletion, unspecified: Secondary | ICD-10-CM | POA: Diagnosis present

## 2020-07-22 DIAGNOSIS — E870 Hyperosmolality and hypernatremia: Secondary | ICD-10-CM | POA: Diagnosis present

## 2020-07-22 DIAGNOSIS — Z79899 Other long term (current) drug therapy: Secondary | ICD-10-CM

## 2020-07-22 DIAGNOSIS — R32 Unspecified urinary incontinence: Secondary | ICD-10-CM | POA: Diagnosis present

## 2020-07-22 DIAGNOSIS — Z7982 Long term (current) use of aspirin: Secondary | ICD-10-CM

## 2020-07-22 DIAGNOSIS — G9341 Metabolic encephalopathy: Secondary | ICD-10-CM | POA: Diagnosis present

## 2020-07-22 DIAGNOSIS — E876 Hypokalemia: Secondary | ICD-10-CM | POA: Diagnosis not present

## 2020-07-22 DIAGNOSIS — R636 Underweight: Secondary | ICD-10-CM | POA: Diagnosis present

## 2020-07-22 DIAGNOSIS — F0390 Unspecified dementia without behavioral disturbance: Secondary | ICD-10-CM | POA: Diagnosis present

## 2020-07-22 DIAGNOSIS — E860 Dehydration: Secondary | ICD-10-CM | POA: Diagnosis present

## 2020-07-22 DIAGNOSIS — D631 Anemia in chronic kidney disease: Secondary | ICD-10-CM | POA: Diagnosis present

## 2020-07-22 DIAGNOSIS — E1121 Type 2 diabetes mellitus with diabetic nephropathy: Secondary | ICD-10-CM | POA: Diagnosis present

## 2020-07-22 DIAGNOSIS — F0391 Unspecified dementia with behavioral disturbance: Secondary | ICD-10-CM | POA: Diagnosis not present

## 2020-07-22 DIAGNOSIS — E785 Hyperlipidemia, unspecified: Secondary | ICD-10-CM | POA: Diagnosis present

## 2020-07-22 DIAGNOSIS — Z87891 Personal history of nicotine dependence: Secondary | ICD-10-CM | POA: Diagnosis not present

## 2020-07-22 LAB — CBG MONITORING, ED
Glucose-Capillary: 112 mg/dL — ABNORMAL HIGH (ref 70–99)
Glucose-Capillary: 181 mg/dL — ABNORMAL HIGH (ref 70–99)

## 2020-07-22 LAB — HEMOGLOBIN A1C
Hgb A1c MFr Bld: 8.4 % — ABNORMAL HIGH (ref 4.8–5.6)
Mean Plasma Glucose: 194.38 mg/dL

## 2020-07-22 LAB — URINALYSIS, COMPLETE (UACMP) WITH MICROSCOPIC
Bilirubin Urine: NEGATIVE
Glucose, UA: NEGATIVE mg/dL
Ketones, ur: NEGATIVE mg/dL
Nitrite: NEGATIVE
Protein, ur: 30 mg/dL — AB
Specific Gravity, Urine: 1.017 (ref 1.005–1.030)
WBC, UA: >50 WBC/hpf — ABNORMAL HIGH (ref 0–5)
pH: 5 (ref 5.0–8.0)

## 2020-07-22 LAB — TROPONIN I (HIGH SENSITIVITY): Troponin I (High Sensitivity): 83 ng/L — ABNORMAL HIGH (ref <18)

## 2020-07-22 LAB — CBC WITH DIFFERENTIAL/PLATELET
Abs Immature Granulocytes: 0.32 10*3/uL — ABNORMAL HIGH (ref 0.00–0.07)
Basophils Absolute: 0.1 10*3/uL (ref 0.0–0.1)
Basophils Relative: 0 %
Eosinophils Absolute: 0 10*3/uL (ref 0.0–0.5)
Eosinophils Relative: 0 %
HCT: 40.8 % (ref 39.0–52.0)
Hemoglobin: 13.4 g/dL (ref 13.0–17.0)
Immature Granulocytes: 2 %
Lymphocytes Relative: 4 %
Lymphs Abs: 0.7 10*3/uL (ref 0.7–4.0)
MCH: 26.9 pg (ref 26.0–34.0)
MCHC: 32.8 g/dL (ref 30.0–36.0)
MCV: 81.9 fL (ref 80.0–100.0)
Monocytes Absolute: 1.4 10*3/uL — ABNORMAL HIGH (ref 0.1–1.0)
Monocytes Relative: 7 %
Neutro Abs: 16.2 10*3/uL — ABNORMAL HIGH (ref 1.7–7.7)
Neutrophils Relative %: 87 %
Platelets: 824 10*3/uL — ABNORMAL HIGH (ref 150–400)
RBC: 4.98 MIL/uL (ref 4.22–5.81)
RDW: 14 % (ref 11.5–15.5)
WBC: 18.7 10*3/uL — ABNORMAL HIGH (ref 4.0–10.5)
nRBC: 0 % (ref 0.0–0.2)

## 2020-07-22 LAB — BASIC METABOLIC PANEL
Anion gap: 23 — ABNORMAL HIGH (ref 5–15)
BUN: 166 mg/dL — ABNORMAL HIGH (ref 8–23)
CO2: 16 mmol/L — ABNORMAL LOW (ref 22–32)
Calcium: 10.1 mg/dL (ref 8.9–10.3)
Chloride: 109 mmol/L (ref 98–111)
Creatinine, Ser: 6.46 mg/dL — ABNORMAL HIGH (ref 0.61–1.24)
GFR, Estimated: 8 mL/min — ABNORMAL LOW (ref >60)
Glucose, Bld: 211 mg/dL — ABNORMAL HIGH (ref 70–99)
Potassium: 5.5 mmol/L — ABNORMAL HIGH (ref 3.5–5.1)
Sodium: 148 mmol/L — ABNORMAL HIGH (ref 135–145)

## 2020-07-22 LAB — POC SARS CORONAVIRUS 2 AG -  ED: SARS Coronavirus 2 Ag: NEGATIVE

## 2020-07-22 LAB — COMPREHENSIVE METABOLIC PANEL
ALT: 38 U/L (ref 0–44)
AST: 40 U/L (ref 15–41)
Albumin: 3.6 g/dL (ref 3.5–5.0)
Alkaline Phosphatase: 97 U/L (ref 38–126)
Anion gap: 19 — ABNORMAL HIGH (ref 5–15)
BUN: 167 mg/dL — ABNORMAL HIGH (ref 8–23)
CO2: 18 mmol/L — ABNORMAL LOW (ref 22–32)
Calcium: 10.3 mg/dL (ref 8.9–10.3)
Chloride: 109 mmol/L (ref 98–111)
Creatinine, Ser: 6.83 mg/dL — ABNORMAL HIGH (ref 0.61–1.24)
GFR, Estimated: 7 mL/min — ABNORMAL LOW (ref >60)
Glucose, Bld: 324 mg/dL — ABNORMAL HIGH (ref 70–99)
Potassium: 7.1 mmol/L (ref 3.5–5.1)
Sodium: 146 mmol/L — ABNORMAL HIGH (ref 135–145)
Total Bilirubin: 1.3 mg/dL — ABNORMAL HIGH (ref 0.3–1.2)
Total Protein: 9 g/dL — ABNORMAL HIGH (ref 6.5–8.1)

## 2020-07-22 LAB — POTASSIUM
Potassium: 4.9 mmol/L (ref 3.5–5.1)
Potassium: 5.2 mmol/L — ABNORMAL HIGH (ref 3.5–5.1)

## 2020-07-22 LAB — SARS CORONAVIRUS 2 BY RT PCR (HOSPITAL ORDER, PERFORMED IN ~~LOC~~ HOSPITAL LAB): SARS Coronavirus 2: POSITIVE — AB

## 2020-07-22 LAB — MAGNESIUM: Magnesium: 3.3 mg/dL — ABNORMAL HIGH (ref 1.7–2.4)

## 2020-07-22 LAB — LACTIC ACID, PLASMA
Lactic Acid, Venous: 2.1 mmol/L (ref 0.5–1.9)
Lactic Acid, Venous: 1.9 mmol/L (ref 0.5–1.9)

## 2020-07-22 MED ORDER — ACETAMINOPHEN 325 MG PO TABS: 650.0000 mg | ORAL_TABLET | Freq: Four times a day (QID) | ORAL | Status: DC | PRN

## 2020-07-22 MED ORDER — CALCIUM GLUCONATE 10 % IV SOLN
INTRAVENOUS | Status: AC
  Filled 2020-07-22: qty 10

## 2020-07-22 MED ORDER — SODIUM BICARBONATE 8.4 % IV SOLN
50.0000 meq | Freq: Once | INTRAVENOUS | Status: AC
  Administered 2020-07-22: 50 meq via INTRAVENOUS
  Filled 2020-07-22: qty 50

## 2020-07-22 MED ORDER — ONDANSETRON HCL 4 MG PO TABS: 4.0000 mg | ORAL_TABLET | Freq: Four times a day (QID) | ORAL | Status: DC | PRN

## 2020-07-22 MED ORDER — SODIUM CHLORIDE 0.9 % IV SOLN
1.0000 g | INTRAVENOUS | Status: DC
  Administered 2020-07-22 – 2020-07-25 (×4): 1 g via INTRAVENOUS
  Filled 2020-07-22: qty 10
  Filled 2020-07-22: qty 1
  Filled 2020-07-22 (×3): qty 10

## 2020-07-22 MED ORDER — SODIUM CHLORIDE 0.45 % IV SOLN: INTRAVENOUS | Status: DC

## 2020-07-22 MED ORDER — HALOPERIDOL LACTATE 5 MG/ML IJ SOLN
INTRAMUSCULAR | Status: AC
  Administered 2020-07-22: 1 mg via INTRAVENOUS
  Filled 2020-07-22: qty 1

## 2020-07-22 MED ORDER — LIDOCAINE HCL (PF) 1 % IJ SOLN: 10.0000 mL | Freq: Once | INTRAMUSCULAR | Status: AC

## 2020-07-22 MED ORDER — HEPARIN SODIUM (PORCINE) 5000 UNIT/ML IJ SOLN
5000.0000 [IU] | Freq: Three times a day (TID) | INTRAMUSCULAR | Status: DC
  Administered 2020-07-22 – 2020-08-02 (×32): 5000 [IU] via SUBCUTANEOUS
  Filled 2020-07-22 (×32): qty 1

## 2020-07-22 MED ORDER — LACTATED RINGERS IV BOLUS (SEPSIS)
1000.0000 mL | Freq: Once | INTRAVENOUS | Status: AC
  Administered 2020-07-22: 1000 mL via INTRAVENOUS

## 2020-07-22 MED ORDER — INSULIN ASPART 100 UNIT/ML ~~LOC~~ SOLN
SUBCUTANEOUS | Status: AC
  Administered 2020-07-22: 10 [IU] via INTRAVENOUS
  Filled 2020-07-22: qty 1

## 2020-07-22 MED ORDER — HALOPERIDOL LACTATE 5 MG/ML IJ SOLN
1.0000 mg | Freq: Four times a day (QID) | INTRAMUSCULAR | Status: DC | PRN
  Administered 2020-07-26: 1 mg via INTRAVENOUS
  Filled 2020-07-22: qty 1

## 2020-07-22 MED ORDER — CALCIUM GLUCONATE 10 % IV SOLN
2.0000 g | Freq: Once | INTRAVENOUS | Status: AC
  Administered 2020-07-22: 2 g via INTRAVENOUS
  Filled 2020-07-22: qty 10

## 2020-07-22 MED ORDER — CHLORHEXIDINE GLUCONATE CLOTH 2 % EX PADS
6.0000 | MEDICATED_PAD | Freq: Every day | CUTANEOUS | Status: DC
  Administered 2020-07-25 – 2020-07-27 (×3): 6 via TOPICAL
  Filled 2020-07-22 (×3): qty 6

## 2020-07-22 MED ORDER — VANCOMYCIN HCL IN DEXTROSE 1-5 GM/200ML-% IV SOLN: 1000.0000 mg | Freq: Once | INTRAVENOUS | Status: DC

## 2020-07-22 MED ORDER — ONDANSETRON HCL 4 MG/2ML IJ SOLN: 4.0000 mg | Freq: Four times a day (QID) | INTRAMUSCULAR | Status: DC | PRN

## 2020-07-22 MED ORDER — SODIUM CHLORIDE 0.9 % IV SOLN
2.0000 g | Freq: Once | INTRAVENOUS | Status: AC
  Administered 2020-07-22: 2 g via INTRAVENOUS
  Filled 2020-07-22: qty 2

## 2020-07-22 MED ORDER — INSULIN ASPART 100 UNIT/ML IV SOLN
10.0000 [IU] | Freq: Once | INTRAVENOUS | Status: AC
  Filled 2020-07-22: qty 0.1

## 2020-07-22 MED ORDER — LIDOCAINE HCL (PF) 1 % IJ SOLN
INTRAMUSCULAR | Status: AC
  Administered 2020-07-22: 10 mL via INTRADERMAL
  Filled 2020-07-22: qty 5

## 2020-07-22 MED ORDER — LIDOCAINE HCL (PF) 1 % IJ SOLN
INTRAMUSCULAR | Status: AC
  Filled 2020-07-22: qty 5

## 2020-07-22 MED ORDER — STERILE WATER FOR INJECTION IV SOLN
INTRAVENOUS | Status: DC
  Filled 2020-07-22 (×4): qty 850
  Filled 2020-07-22: qty 150
  Filled 2020-07-22 (×2): qty 850

## 2020-07-22 MED ORDER — VANCOMYCIN HCL 1500 MG/300ML IV SOLN
1500.0000 mg | Freq: Once | INTRAVENOUS | Status: AC
  Administered 2020-07-22: 1500 mg via INTRAVENOUS
  Filled 2020-07-22: qty 300

## 2020-07-22 MED ORDER — ACETAMINOPHEN 650 MG RE SUPP: 650.0000 mg | Freq: Four times a day (QID) | RECTAL | Status: DC | PRN

## 2020-07-22 MED ORDER — INSULIN ASPART 100 UNIT/ML ~~LOC~~ SOLN
0.0000 [IU] | SUBCUTANEOUS | Status: DC
  Administered 2020-07-22 – 2020-07-23 (×2): 2 [IU] via SUBCUTANEOUS
  Administered 2020-07-23 – 2020-07-24 (×4): 1 [IU] via SUBCUTANEOUS
  Administered 2020-07-24 (×2): 2 [IU] via SUBCUTANEOUS
  Administered 2020-07-24: 1 [IU] via SUBCUTANEOUS
  Administered 2020-07-24: 2 [IU] via SUBCUTANEOUS
  Filled 2020-07-22 (×9): qty 1

## 2020-07-22 MED ORDER — LACTATED RINGERS IV SOLN: INTRAVENOUS | Status: AC

## 2020-07-22 MED ORDER — ZIPRASIDONE MESYLATE 20 MG IM SOLR
20.0000 mg | Freq: Once | INTRAMUSCULAR | Status: AC
  Administered 2020-07-22: 20 mg via INTRAMUSCULAR
  Filled 2020-07-22: qty 20

## 2020-07-22 NOTE — ED Notes (Signed)
Returned from CT scan. Dialysis states they will come to get patient soon.

## 2020-07-22 NOTE — Consult Note (Signed)
[facility] Vascular Consult Note  MRN : [id number]  Shawn May is a 87 y.o. ([birth year]) male who presents with chief complaint of  Chief Complaint  Patient presents with  . Shortness of Breath  . Weakness   History of Present Illness:  Patient presents to the ED with complaints of shortness of breath and weakness, per his wife. She further reports decreased p.o. intake, generalized weakness and "not acting himself. "  Patient is critically ill.  He is not alert and oriented.  Patient seems to be encephalopathic in acute renal failure who needs dialysis emergently.  At this time, the patient does not have an adequate access to dialyze.  Vascular surgery was consulted by Dr. Tamala Julian for placement of a temporary dialysis catheter.  The patient's family was unable to be contacted in regard to consent.  Since this was an emergent situation local physician was Dr. Lucky Cowboy and Dr. Tamala Julian signed the consent.  Current Facility-Administered Medications  Medication Dose Route Frequency Provider Last Rate Last Admin  . ceFEPIme (MAXIPIME) 2 g in sodium chloride 0.9 % 100 mL IVPB  2 g Intravenous Once Vladimir Crofts, MD      . Derrill Memo ON 07/23/2020] Chlorhexidine Gluconate Cloth 2 % PADS 6 each  6 each Topical Q0600 Lateef, [place], MD      . lactated ringers bolus 1,000 mL  1,000 mL Intravenous Once Vladimir Crofts, MD      . lactated ringers infusion   Intravenous Continuous Vladimir Crofts, MD      . vancomycin (VANCOREADY) IVPB 1500 mg/300 mL  1,500 mg Intravenous Once Darnelle Bos, [facility]       Current Outpatient Medications  Medication Sig Dispense Refill  . aspirin 81 MG chewable tablet Chew 1 tablet (81 mg total) by mouth daily. 30 tablet 2  . atorvastatin (LIPITOR) 40 MG tablet Take 40 mg by mouth daily.    . cholecalciferol (VITAMIN D3) 25 MCG (1000 UT) tablet Take 2,000 Units by mouth daily.    Marland Kitchen donepezil (ARICEPT) 5 MG tablet Take 5 mg by mouth at bedtime.    Marland Kitchen  glipiZIDE (GLUCOTROL XL) 10 MG 24 hr tablet Take 10 mg by mouth 2 (two) times daily.    . hydrochlorothiazide (HYDRODIURIL) 25 MG tablet Take 25 mg by mouth daily.    Marland Kitchen linagliptin (TRADJENTA) 5 MG TABS tablet Take 5 mg by mouth daily.    . memantine (NAMENDA) 5 MG tablet SMARTSIG:1 Tablet(s) By Mouth Twice Daily    . quinapril (ACCUPRIL) 10 MG tablet Take 10 mg by mouth daily.    . sodium bicarbonate 650 MG tablet Take by mouth.     Past Medical History:  Diagnosis Date  . Dementia ([place])   . Diabetes mellitus without complication ([place])   . Hyperlipidemia   . Hypertension   . Renal disorder    History reviewed. No pertinent surgical history.  Social History Social History   Tobacco Use  . Smoking status: Former Research scientist (life sciences)  . Smokeless tobacco: Never Used  Substance Use Topics  . Alcohol use: Not Currently  . Drug use: Not Currently   Family History History reviewed. No pertinent family history.  Unable to gather as the patient is critically ill with no family members at the bedside.  No Known Allergies  REVIEW OF SYSTEMS (Negative unless checked)  Constitutional: '[]'$ Weight loss  '[]'$ Fever  '[]'$ Chills Cardiac: '[]'$ Chest pain   '[]'$ Chest pressure   '[]'$ Palpitations   '[]'$ Shortness of breath when  laying flat   '[]'$ Shortness of breath at rest   '[]'$ Shortness of breath with exertion. Vascular:  '[]'$ Pain in legs with walking   '[]'$ Pain in legs at rest   '[]'$ Pain in legs when laying flat   '[]'$ Claudication   '[]'$ Pain in feet when walking  '[]'$ Pain in feet at rest  '[]'$ Pain in feet when laying flat   '[]'$ History of DVT   '[]'$ Phlebitis   '[]'$ Swelling in legs   '[]'$ Varicose veins   '[]'$ Non-healing ulcers Pulmonary:   '[]'$ Uses home oxygen   '[]'$ Productive cough   '[]'$ Hemoptysis   '[]'$ Wheeze  '[]'$ COPD   '[]'$ Asthma Neurologic:  '[]'$ Dizziness  '[]'$ Blackouts   '[]'$ Seizures   '[]'$ History of stroke   '[]'$ History of TIA  '[]'$ Aphasia   '[]'$ Temporary blindness   '[]'$ Dysphagia   '[]'$ Weakness or numbness in arms   '[]'$ Weakness or numbness in legs Musculoskeletal:   '[]'$ Arthritis   '[]'$ Joint swelling   '[]'$ Joint pain   '[]'$ Low back pain Hematologic:  '[]'$ Easy bruising  '[]'$ Easy bleeding   '[]'$ Hypercoagulable state   '[]'$ Anemic  '[]'$ Hepatitis Gastrointestinal:  '[]'$ Blood in stool   '[]'$ Vomiting blood  '[]'$ Gastroesophageal reflux/heartburn   '[]'$ Difficulty swallowing. Genitourinary:  '[]'$ Chronic kidney disease   '[]'$ Difficult urination  '[]'$ Frequent urination  '[]'$ Burning with urination   '[]'$ Blood in urine Skin:  '[]'$ Rashes   '[]'$ Ulcers   '[]'$ Wounds Psychological:  '[]'$ History of anxiety   '[]'$  History of major depression.  Unable to obtain the review of systems due to the patient's severe systemic illness and altered mental status.    Physical Examination  Vitals:   08/01/2020 1056 08/01/2020 1106 07/09/2020 1246 07/13/2020 1315  BP:   116/79 128/84  Pulse:   (!) 105 (!) 108  Resp:   18 20  Temp:  97.7 F (36.5 C)    TempSrc:  Axillary    SpO2:   100% 100%  Weight: 72.6 kg     Height: '5\' 8"'$  (1.727 m)      Body mass index is 24.33 kg/m. Gen: Critically ill Head: [place]/AT, No temporalis wasting Ear/Nose/Throat: Hearing grossly intact, nares w/o erythema or drainage Eyes: Sclera non-icteric, conjunctiva clear Neck: Supple, no nuchal rigidity.  No JVD.  Pulmonary:  Good air movement, clear to auscultation bilaterally.  Cardiac: RRR, normal S1, S2, no Murmurs, rubs or gallops. Vascular: Extremities warm distally Gastrointestinal: soft, non-tender/non-distended. No guarding/reflex.  Musculoskeletal: M/S 5/5 throughout.  Extremities without ischemic changes.  No deformity or atrophy. Mild edema in the lower extremities bilaterally Neurologic: Patient is critically ill unable to examine Psychiatric: Difficult to assess due to the severity of patient's illness. Dermatologic: No rashes or ulcers noted.   Lymph : No Cervical, Axillary, or Inguinal lymphadenopathy.  CBC Lab Results  Component Value Date   WBC 18.7 (H) 07/05/2020   HGB 13.4 07/08/2020   HCT 40.8 07/28/2020   MCV 81.9 07/23/2020   PLT  824 (H) 07/10/2020   BMET    Component Value Date/Time   NA 146 (H) 07/25/2020 1058   K 7.1 (HH) 07/20/2020 1058   CL 109 07/20/2020 1058   CO2 18 (L) 07/24/2020 1058   GLUCOSE 324 (H) 07/26/2020 1058   BUN 167 (H) 07/29/2020 1058   CREATININE 6.83 (H) 07/06/2020 1058   CALCIUM 10.3 07/17/2020 1058   GFRNONAA 7 (L) 07/14/2020 1058   GFRAA 27 (L) 12/30/2019 1519   Estimated Creatinine Clearance: 7.5 mL/min (A) (by C-G formula based on SCr of 6.83 mg/dL (H)).  COAG No results found for: INR, PROTIME  Radiology DG Chest Portable 1 View  Result Date: 07/21/2020 CLINICAL DATA:  SOB and weak, eval infiltrates EXAM: PORTABLE CHEST 1 VIEW COMPARISON:  12/30/2019. FINDINGS: No focal consolidation. No pneumothorax or pleural effusion. Cardiomediastinal silhouette is within normal limits. No acute osseous abnormality. IMPRESSION: No focal airspace disease. Electronically Signed   By: Primitivo Gauze M.D.   On: 07/19/2020 11:31   Assessment/Plan Patient presents to the ED with complaints of shortness of breath and weakness, per his wife. She further reports decreased p.o. intake, generalized weakness and "not acting himself. "  1. ARF: We will proceed with temporary dialysis catheter placement at this time.  Risks and benefits discussed with patient and/or family, and the catheter will be placed to allow immediate initiation of dialysis.  If the patient's renal function does not improve throughout the hospital course, we will be happy to place a tunneled dialysis catheter for long term use prior to discharge.  The patient's family was unable to be contacted in regard to consent.  Since this was an emergent situation local physician was Dr. Lucky Cowboy and Dr. Tamala Julian signed the consent.  The signed consent was placed in slot 17 in the ED where the chart should be kept.  Seen and examined with Dr. Mayme Genta, PA-C  07/25/2020 2:14 PM

## 2020-07-22 NOTE — ED Triage Notes (Signed)
Pt comes into the Ed via [facility] c/o [facility] and weakness from home with his wife.  Pt has had decreased oral intake for a couple days with increased weakness.  Initial room air sats were in the 80's, patient placed on nasal cannula and bumped into the low 90's, patient refusing to keep non-rebreather on face at this time.

## 2020-07-22 NOTE — Sepsis Progress Note (Signed)
eLink monitoring this Code Sepsis. 

## 2020-07-22 NOTE — Consult Note (Signed)
PHARMACY -  BRIEF ANTIBIOTIC NOTE   Pharmacy has received consult(s) for vancomycin and cefepime from an ED provider.  The patient's profile has been reviewed for ht/wt/allergies/indication/available labs.    One time order(s) placed for vancomycin 1500 mg and cefepime 2g  Further antibiotics/pharmacy consults should be ordered by admitting physician if indicated.                       Thank you, Darnelle Bos 07/05/2020  1:44 PM

## 2020-07-22 NOTE — H&P (Signed)
History and Physical    Shawn May [id number] DOB: 1931-07-28 DOA: 07/29/2020  PCP: [facility]   Patient coming from: Home  I have personally briefly reviewed patient's old medical records in [place]  Chief Complaint: Weakness Most of the history is obtained from ER notes and patient's wife over the phone patient is unable to provide any history  HPI: Shawn May is a 87 y.o. male with medical history significant for dementia??  Alzheimer's versus vascular, history of CVA, diabetes mellitus with complications of stage III chronic kidney disease, hypertension and dyslipidemia who was brought into the ER by EMS for evaluation of shortness of breath and weakness.  Per patient's wife he has had poor oral intake for the last couple of days and has been increasingly weak.  Patient states that he has not had any falls at home.  Per EMS his room air pulse oximetry at rest was in the 80s and improved following oxygen supplementation to the low 90s. Patient is agitated and unable to provide any history at this time.  I am unable to do review of systems on this patient due to his mental status changes. Labs show sodium 146, potassium 7.1, chloride 109, bicarb 18, anion gap 19, glucose 324, BUN 167, creatinine 6.83, calcium 10.3, magnesium 3.3, alkaline phosphatase 97, albumin 3.6, AST 40, ALT 38, total protein 9.0, total bilirubin 1.3, troponin 83, lactic acid 2.1, white count 18.7, hemoglobin 13.4, hematocrit 40.8, MCV 81.1, RDW 14.0, platelet count 824 SARS coronavirus 2 antigen is negative Urine analysis shows pyuria Chest x-ray shows no acute findings Twelve-lead EKG reviewed by me shows sinus tachycardia    ED Course: Patient is an 87 year old African-American male with a history significant for dementia, diabetes mellitus with complications of stage III chronic kidney disease who was brought into the ER by EMS for evaluation of mental status changes and  generalized weakness and noted to have an acute kidney injury.  At baseline patient has a serum creatinine of 2.46 and 86.83 today on admission, potassium is 7.1 and BUN is elevated at 167 from a baseline of 49.  Nephrology was consulted by emergency room physician and a temporary dialysis catheter was inserted for hemodialysis.  Patient appears to be septic most likely from urinary source and received broad-spectrum antibiotic therapy with vancomycin and cefepime.  He will be admitted to the hospital for further evaluation.  Review of Systems: As per HPI otherwise all systems reviewed and negative.    Past Medical History:  Diagnosis Date  . Dementia ([place])   . Diabetes mellitus without complication ([place])   . Hyperlipidemia   . Hypertension   . Renal disorder     History reviewed. No pertinent surgical history.   reports that he has quit smoking. He has never used smokeless tobacco. He reports previous alcohol use. He reports previous drug use.  No Known Allergies  History reviewed. No pertinent family history.   Prior to Admission medications   Medication Sig Start Date End Date Taking? Authorizing Provider  aspirin 81 MG chewable tablet Chew 1 tablet (81 mg total) by mouth daily. 09/13/18   Demetrios Loll, MD  atorvastatin (LIPITOR) 40 MG tablet Take 40 mg by mouth daily.    [provider]  cholecalciferol (VITAMIN D3) 25 MCG (1000 UT) tablet Take 2,000 Units by mouth daily.    [provider]  donepezil (ARICEPT) 5 MG tablet Take 5 mg by mouth at bedtime.  [provider]  glipiZIDE (GLUCOTROL XL) 10 MG 24 hr tablet Take 10 mg by mouth 2 (two) times daily.    [provider]  hydrochlorothiazide (HYDRODIURIL) 25 MG tablet Take 25 mg by mouth daily.    [provider]  linagliptin (TRADJENTA) 5 MG TABS tablet Take 5 mg by mouth daily.    [provider]  memantine (NAMENDA) 5 MG tablet SMARTSIG:1 Tablet(s) By Mouth Twice Daily  01/20/20   [provider]  quinapril (ACCUPRIL) 10 MG tablet Take 10 mg by mouth daily.    [provider]  sodium bicarbonate 650 MG tablet Take by mouth. 12/29/19 12/28/20  [provider]    Physical Exam: Vitals:   07/31/2020 1056 07/15/2020 1106 07/10/2020 1246 07/29/2020 1315  BP:   116/79 128/84  Pulse:   (!) 105 (!) 108  Resp:   18 20  Temp:  97.7 F (36.5 C)    TempSrc:  Axillary    SpO2:   100% 100%  Weight: 72.6 kg     Height: '5\' 8"'$  (1.727 m)        Vitals:   07/23/2020 1056 08/01/2020 1106 07/05/2020 1246 07/19/2020 1315  BP:   116/79 128/84  Pulse:   (!) 105 (!) 108  Resp:   18 20  Temp:  97.7 F (36.5 C)    TempSrc:  Axillary    SpO2:   100% 100%  Weight: 72.6 kg     Height: '5\' 8"'$  (1.727 m)       Constitutional: NAD, awake.  Not oriented to person, place or time.  Agitated and pulling on lines and mittens, chronically ill-appearing and frail Eyes: PERRL, lids and conjunctivae pallor ENMT: Mucous membranes are dry Neck: normal, supple, no masses, no thyromegaly Respiratory: clear to auscultation bilaterally, no wheezing, no crackles. Normal respiratory effort. No accessory muscle use.   Cardiovascular: Tachycardia,no murmurs / rubs / gallops. No extremity edema. 2+ pedal pulses. No carotid bruits.  Abdomen: no tenderness, no masses palpated. No hepatosplenomegaly. Bowel sounds positive.  Temporary dialysis catheter in the right groin Musculoskeletal: no clubbing / cyanosis. No joint deformity upper and lower extremities.  Skin: no rashes, lesions, ulcers.  Neurologic: Moves all extremities Psychiatric: Unable to assess   Labs on Admission: I have personally reviewed following labs and imaging studies  CBC: Recent Labs  Lab 08/02/2020 1058  WBC 18.7*  NEUTROABS 16.2*  HGB 13.4  HCT 40.8  MCV 81.9  PLT [id number]*   Basic Metabolic Panel: Recent Labs  Lab 07/27/2020 1058  NA 146*  K 7.1*  CL 109  CO2 18*  GLUCOSE 324*  BUN 167*  CREATININE  6.83*  CALCIUM 10.3  MG 3.3*   GFR: Estimated Creatinine Clearance: 7.5 mL/min (A) (by C-G formula based on SCr of 6.83 mg/dL (H)). Liver Function Tests: Recent Labs  Lab 07/15/2020 1058  AST 40  ALT 38  ALKPHOS 97  BILITOT 1.3*  PROT 9.0*  ALBUMIN 3.6   No results for input(s): LIPASE, AMYLASE in the last 168 hours. No results for input(s): AMMONIA in the last 168 hours. Coagulation Profile: No results for input(s): INR, PROTIME in the last 168 hours. Cardiac Enzymes: No results for input(s): CKTOTAL, CKMB, CKMBINDEX, TROPONINI in the last 168 hours. BNP (last 3 results) No results for input(s): PROBNP in the last 8760 hours. HbA1C: No results for input(s): HGBA1C in the last 72 hours. CBG: No results for input(s): GLUCAP in the last 168 hours. Lipid Profile:  No results for input(s): CHOL, HDL, LDLCALC, TRIG, CHOLHDL, LDLDIRECT in the last 72 hours. Thyroid Function Tests: No results for input(s): TSH, T4TOTAL, FREET4, T3FREE, THYROIDAB in the last 72 hours. Anemia Panel: No results for input(s): VITAMINB12, FOLATE, FERRITIN, TIBC, IRON, RETICCTPCT in the last 72 hours. Urine analysis:    Component Value Date/Time   COLORURINE AMBER (A) 07/20/2020 1417   APPEARANCEUR TURBID (A) 07/29/2020 1417   LABSPEC 1.017 07/15/2020 1417   PHURINE 5.0 07/25/2020 1417   GLUCOSEU NEGATIVE 07/21/2020 1417   HGBUR MODERATE (A) 07/24/2020 1417   BILIRUBINUR NEGATIVE 07/03/2020 1417   [place] 07/19/2020 1417   PROTEINUR 30 (A) 07/18/2020 1417   NITRITE NEGATIVE 07/11/2020 1417   LEUKOCYTESUR LARGE (A) 07/21/2020 1417    Radiological Exams on Admission: DG Chest Portable 1 View  Result Date: 07/14/2020 CLINICAL DATA:  SOB and weak, eval infiltrates EXAM: PORTABLE CHEST 1 VIEW COMPARISON:  12/30/2019. FINDINGS: No focal consolidation. No pneumothorax or pleural effusion. Cardiomediastinal silhouette is within normal limits. No acute osseous abnormality. IMPRESSION: No focal  airspace disease. Electronically Signed   By: Primitivo Gauze M.D.   On: 07/04/2020 11:31    EKG: Independently reviewed.  Sinus tachycardia  Assessment/Plan Principal Problem:   Sepsis ([place]) Active Problems:   AKI (acute kidney injury) ([place])   Dementia ([facility])   Type 2 diabetes mellitus with stage 3 chronic kidney disease ([place])   Acute lower UTI     Sepsis from a urinary source with complications of acute kidney injury POA  As evidenced by tachycardia, marked leukocytosis with a left shift, pyuria and elevated lactic acid level 2.1 Patient also noted to have worsening renal failure At baseline he has stage III chronic kidney disease secondary to diabetes with a serum creatinine of 2.46 which is 6.83 today on admission with potassium levels of 7.1 and elevated BUN of 167 Aggressive IV fluid resuscitation with bicarbonate containing fluids Start patient on Rocephin 1 g IV daily while awaiting results of urine culture Obtain CT scan of abdomen and pelvis without contrast to rule out obstructive uropathy as the etiology for his worsening renal failure Follow-up results of blood cultures    Acute on chronic kidney injury Patient has had to have worsening renal function with a serum creatinine of 6.83 on admission compared to baseline of 2.46 associated with hyperkalemia and elevated BUN levels Hold quinapril and hydrochlorothiazide Patient received insulin, dextrose, calcium gluconate and albuterol for hyperkalemia Start patient on IV fluids containing bicarbonate Obtain CT scan of abdomen and pelvis to rule out obstructive uropathy Nephrology has been consulted and the plan is to initiate emergency renal replacement therapy    Type 2 diabetes mellitus with stage III chronic kidney disease Uncontrolled Hydrate patient Glycemic control with insulin Hold Glipizide and Tradjenta    Acute metabolic encephalopathy In a patient with underlying dementia Secondary to sepsis  from a urinary source We will administer Haldol as needed as needed Safety sitter will be ordered due to patient's increased risk for falls and injury to self Hold Namenda and Aricept   DVT prophylaxis: Heparin Code Status: DO NOT RESUSCITATE Family Communication: Greater than 50% of time was spent discussing patient's condition and plan of care with his wife over the phone.  She understands that patient is critically ill.  All questions and concerns have been addressed.  CODE STATUS was discussed and he is a DO NOT RESUSCITATE. Disposition Plan: Back to previous home environment Consults called: Nephrology  Collier Bullock MD [facility]     07/25/2020, 2:59 PM

## 2020-07-22 NOTE — ED Notes (Signed)
Patient remains in dialysis.

## 2020-07-22 NOTE — Progress Notes (Addendum)
Patinet into the suite for 1st dialysis treatment to be completed. Report received from ED RN, Shirlean Mylar D. Patient transferred to the unit as a COVID positive patient per the transporter. Report received from Shirlean Mylar states patient was responsive, having weakness and potassium was elevated upon arrival at 7.0 but the second result was 4.9. Patient arrived to the suite with mittens to bilateral hands, unresponsive to verbal stimuli, V/S WNL, patient received a femoral catheter earlier today which explains the sluggish behavior of the patient and treatment started. Dr. Holley Raring made aware patient now considered a COVID+ patient. Treatment started.   Rapid response called at this time with patient demonstrating a drastic change in condition with RN noticing shallow breathing with short breaths, increased HR, eyes fixation, blood pressure unable to be obtained and changes noted with EKG. Oxygen sats remained at 100% during this episode. RN rinsed the patient back with NS at this time and called Dr. Holley Raring to inform of the situation. Rapid Response Team at the bedside and states patient had hiccups. Treatment restarted at 1930pm and Dr. Holley Raring made aware. BP's during treatment were drastically changing from extremely higher than normal to extremely lower than normal to not able to be obtained at all during repeats for blood pressures. Blood pressure noted to be WNL after patient was rinsed back post treatment. Treatment discontinued 45 minutes early due to clotted venous chamber and MD made aware. No new orders at this time and patient returned to ED 31.

## 2020-07-22 NOTE — ED Notes (Signed)
Date and time results received: 07/21/2020 1208 (use smartphrase ".now" to insert current time)  Test: potassium Critical Value: 7.1  Name of Provider Notified: Tamala Julian, MD  Orders Received? Or Actions Taken?: Orders Received - See Orders for details

## 2020-07-22 NOTE — Sepsis Progress Note (Signed)
Notified provider of need to order repeat lactic acid. ° °

## 2020-07-22 NOTE — Progress Notes (Signed)
Responded to  Rapid response. Patient in dialysis. On o2 at 2 liters. Vitals stable. Appears to be having hiccups upon arrival. Staff unsure of mental base line. Patient does arose when call name. Post cva, dementia noted per rapid response RN. Patient in no distress.

## 2020-07-22 NOTE — ED Provider Notes (Signed)
[facility] Emergency Department Provider Note ____________________________________________   Event Date/Time   First MD Initiated Contact with Patient 07/26/2020 1051     (approximate)  I have reviewed the triage vital signs and the nursing notes.  HISTORY  Chief Complaint Shortness of Breath and Weakness   HPI Shawn May is a 87 y.o. malewho presents to the ED for evaluation of generalized weakness and shortness of breath.  Chart review indicates history of dementia, HTN, HLD and DM on multiple oral agents.  Patient presents to the ED with complaints of shortness of breath and weakness, per his wife. She further reports decreased p.o. intake, generalized weakness and "not acting himself. "  Patient unable to provide any relevant history, due to confusion and encephalopathy, limiting HPI.   Past Medical History:  Diagnosis Date  . Dementia ([place])   . Diabetes mellitus without complication ([place])   . Hyperlipidemia   . Hypertension   . Renal disorder     Patient Active Problem List   Diagnosis Date Noted  . CVA (cerebral vascular accident) ([place]) 09/11/2018    History reviewed. No pertinent surgical history.  Prior to Admission medications   Medication Sig Start Date End Date Taking? Authorizing Provider  aspirin 81 MG chewable tablet Chew 1 tablet (81 mg total) by mouth daily. 09/13/18   Demetrios Loll, MD  atorvastatin (LIPITOR) 40 MG tablet Take 40 mg by mouth daily.    [provider]  cholecalciferol (VITAMIN D3) 25 MCG (1000 UT) tablet Take 2,000 Units by mouth daily.    [provider]  donepezil (ARICEPT) 5 MG tablet Take 5 mg by mouth at bedtime.    [provider]  glipiZIDE (GLUCOTROL XL) 10 MG 24 hr tablet Take 10 mg by mouth 2 (two) times daily.    [provider]  hydrochlorothiazide (HYDRODIURIL) 25 MG tablet Take 25 mg by mouth daily.    [provider]  linagliptin (TRADJENTA) 5 MG TABS  tablet Take 5 mg by mouth daily.    [provider]  memantine (NAMENDA) 5 MG tablet SMARTSIG:1 Tablet(s) By Mouth Twice Daily 01/20/20   [provider]  quinapril (ACCUPRIL) 10 MG tablet Take 10 mg by mouth daily.    [provider]  sodium bicarbonate 650 MG tablet Take by mouth. 12/29/19 12/28/20  [provider]    Allergies Patient has no known allergies.  History reviewed. No pertinent family history.  Social History Social History   Tobacco Use  . Smoking status: Former Research scientist (life sciences)  . Smokeless tobacco: Never Used  Substance Use Topics  . Alcohol use: Not Currently  . Drug use: Not Currently    Review of Systems  Unable to be accurately assessed due to patient's acute encephalopathy ____________________________________________   PHYSICAL EXAM:  VITAL SIGNS: Vitals:   07/27/2020 1246 08/01/2020 1315  BP: 116/79 128/84  Pulse: (!) 105 (!) 108  Resp: 18 20  Temp:    SpO2: 100% 100%      Constitutional: Alert and briefly redirectable. Cachectic. Eyes: Conjunctivae are normal. PERRL. EOMI. Head: Atraumatic. Nose: No congestion/rhinnorhea. Mouth/Throat: Mucous membranes are dry.  Oropharynx non-erythematous. Neck: No stridor. No cervical spine tenderness to palpation. Cardiovascular: Tachycardic rate, regular rhythm. Grossly normal heart sounds.  Good peripheral circulation. Respiratory: Slight tachypnea in the low 20s, no further evidence of distress.  No retractions. Lungs CTAB. Gastrointestinal: Soft , nondistended, nontender to palpation. No CVA tenderness. Musculoskeletal: No lower extremity tenderness nor edema.  No joint effusions. No signs of acute trauma. Neurologic: No gross focal neurologic deficits are appreciated. Skin:  Skin is warm, dry and intact. No rash noted. Psychiatric: Unable to be assessed  ____________________________________________   LABS (all labs ordered are listed, but only abnormal results are  displayed)  Labs Reviewed  COMPREHENSIVE METABOLIC PANEL - Abnormal; Notable for the following components:      Result Value   Sodium 146 (*)    Potassium 7.1 (*)    CO2 18 (*)    Glucose, Bld 324 (*)    BUN 167 (*)    Creatinine, Ser 6.83 (*)    Total Protein 9.0 (*)    Total Bilirubin 1.3 (*)    GFR, Estimated 7 (*)    Anion gap 19 (*)    All other components within normal limits  CBC WITH DIFFERENTIAL/PLATELET - Abnormal; Notable for the following components:   WBC 18.7 (*)    Platelets 824 (*)    Neutro Abs 16.2 (*)    Monocytes Absolute 1.4 (*)    Abs Immature Granulocytes 0.32 (*)    All other components within normal limits  MAGNESIUM - Abnormal; Notable for the following components:   Magnesium 3.3 (*)    All other components within normal limits  LACTIC ACID, PLASMA - Abnormal; Notable for the following components:   Lactic Acid, Venous 2.1 (*)    All other components within normal limits  TROPONIN I (HIGH SENSITIVITY) - Abnormal; Notable for the following components:   Troponin I (High Sensitivity) 83 (*)    All other components within normal limits  SARS CORONAVIRUS 2 BY RT PCR (HOSPITAL ORDER, [place] [facility])  CULTURE, BLOOD (SINGLE)  URINALYSIS, COMPLETE (UACMP) WITH MICROSCOPIC  POTASSIUM  POTASSIUM  POTASSIUM  POTASSIUM  POC SARS CORONAVIRUS 2 AG -  ED  TROPONIN I (HIGH SENSITIVITY)   ____________________________________________  12 Lead EKG  Sinus rhythm, rate of 115 bpm. Tremulous, causing poor quality EKG. Normal axis and intervals. No clear evidence of acute ischemia. ____________________________________________  RADIOLOGY  ED MD interpretation: 1 view CXR reviewed by me without evidence of acute cardiopulmonary pathology.  Official radiology report(s): DG Chest Portable 1 View  Result Date: 07/10/2020 CLINICAL DATA:  SOB and weak, eval infiltrates EXAM: PORTABLE CHEST 1 VIEW COMPARISON:  12/30/2019. FINDINGS: No focal  consolidation. No pneumothorax or pleural effusion. Cardiomediastinal silhouette is within normal limits. No acute osseous abnormality. IMPRESSION: No focal airspace disease. Electronically Signed   By: Primitivo Gauze M.D.   On: 07/31/2020 11:31    ____________________________________________   PROCEDURES and INTERVENTIONS  Procedure(s) performed (including Critical Care):  .1-3 Lead EKG Interpretation Performed by: Vladimir Crofts, MD Authorized by: Vladimir Crofts, MD     Interpretation: abnormal     ECG rate:  110   ECG rate assessment: tachycardic     Rhythm: sinus tachycardia     Ectopy: none     Conduction: normal   .Critical Care Performed by: Vladimir Crofts, MD Authorized by: Vladimir Crofts, MD   Critical care provider statement:    Critical care time (minutes):  45   Critical care was necessary to treat or prevent imminent or life-threatening deterioration of the following conditions:  Metabolic crisis   Critical care was time spent personally by me on the following activities:  Discussions with consultants, evaluation of patient's response to treatment, examination of patient, ordering and performing treatments and interventions, ordering and review of laboratory studies, ordering and review  of radiographic studies, pulse oximetry, re-evaluation of patient's condition, obtaining history from patient or surrogate and review of old charts    Medications  Chlorhexidine Gluconate Cloth 2 % PADS 6 each (has no administration in time range)  lactated ringers infusion (has no administration in time range)  lactated ringers bolus 1,000 mL (has no administration in time range)  ceFEPIme (MAXIPIME) 2 g in sodium chloride 0.9 % 100 mL IVPB (has no administration in time range)  vancomycin (VANCOREADY) IVPB 1500 mg/300 mL (has no administration in time range)  calcium gluconate inj 10% (1 g) URGENT USE ONLY! (2 g Intravenous Given 07/13/2020 1243)  insulin aspart (novoLOG) injection 10  Units (10 Units Intravenous Given 07/14/2020 1250)  sodium bicarbonate injection 50 mEq (50 mEq Intravenous Given 07/11/2020 1244)  lidocaine (PF) (XYLOCAINE) 1 % injection 10 mL (10 mLs Intradermal Given by Other 07/07/2020 1314)    ____________________________________________   MDM / ED COURSE   87 year old male with CKD presents to the ED weak and short of breath with evidence of hyperkalemia requiring emergent hemodialysis, as well as signs of sepsis. Patient hemodynamically stable, but persistently tachycardic in a sinus tachycardia. Exam difficult due to his confusion and encephalopathy. He is in no significant distress and has no evidence of neurovascular deficits or trauma. Does appear dehydrated. Blood work with potassium of 7.1, BUN of 170 and new elevation of creatinine requiring emergent hemodialysis. I discussed the case with nephrology, who will facilitate dialysis here in the ED. I attempted to place a femoral temporary dialysis catheter, but his anatomy is quite difficult with bilateral femoral arteries directly overlying femoral veins and I therefore discontinued the procedure and discussed the case with vascular surgery, who agrees to place temporary dialysis catheter. We will admit the patient to hospitalist medicine for further work-up and management. Temporizing measures provided for hyperkalemia.  Clinical Course as of 07/07/2020 1349  Thu Jul 22, 2020  1213 Potassium noted.  [DS]  1218 I speak with Dr. Holley Raring, who recommends temporary dialysis catheter placement to facilitate emergent hemodialysis. [DS]  [id number] I attempted to place a right-sided femoral temporary dialysis catheter.  Unfortunately, his anatomy is quite difficult with his femoral artery directly overlying his femoral vein bilaterally.  I see no clear available venous access.  We will therefore consult vascular surgery to assist with temporary dialysis catheter placement. [DS]  15 I speak with Dr. Delana Meyer who will come  place temp dialysis catheter  [DS]    Clinical Course User Index [DS] Vladimir Crofts, MD    ____________________________________________   FINAL CLINICAL IMPRESSION(S) / ED DIAGNOSES  Final diagnoses:  Hyperkalemia  Generalized weakness  Sepsis with acute renal failure without septic shock, due to unspecified organism, unspecified acute renal failure type [facility])     ED Discharge Orders    None       Maliha Outten   Note:  This document was prepared using Dragon voice recognition software and may include unintentional dictation errors.   Vladimir Crofts, MD 07/06/2020 1351

## 2020-07-22 NOTE — ED Notes (Signed)
Patient transported to CT 

## 2020-07-22 NOTE — ED Notes (Signed)
Patients wife provided with update at this time.

## 2020-07-22 NOTE — ED Notes (Signed)
Patient agitated and fighting staff, attempting to get out of bed, mittens placed on patient.

## 2020-07-22 NOTE — Progress Notes (Signed)
Rapid Response Event Note   Reason for Call :   AMS, Hiccups    Initial Focused Assessment:   VS WNL, patient appears to be at baseline mental status. Responsive to voice. Follows commands.  Interventions:   VS obtained.  Plan of Care:   Patient to resume HD.  Event Summary:   MD Notified: Dr. Francine Graven Call Time: 1911 Arrival Time: 1920 End Time: 1925  Jesse Sans, RN

## 2020-07-22 NOTE — ED Notes (Signed)
Patient taken to dialysis. 

## 2020-07-22 NOTE — ED Notes (Signed)
Dr. Lucky Cowboy placed right femoral vas cath at this time, patient tolerated well. Patient confused. Consent obtained via wife on phone.

## 2020-07-22 NOTE — Consult Note (Signed)
CODE SEPSIS - PHARMACY COMMUNICATION  **Broad Spectrum Antibiotics should be administered within 1 hour of Sepsis diagnosis**  Time Code Sepsis Called/Page Received: 1341  Antibiotics Ordered: 1339  Time of 1st antibiotic administration: 1426  Additional action taken by pharmacy: Secure chat to RN   If necessary, Name of Provider/Nurse Contacted: Bryan Lemma ,PharmD Clinical Pharmacist  07/30/2020  1:41 PM

## 2020-07-22 NOTE — Op Note (Signed)
  OPERATIVE NOTE   PROCEDURE: 1. Ultrasound guidance for vascular access right femoral vein 2. Placement of a 30 cm triple lumen dialysis catheter right femoral vein  PRE-OPERATIVE DIAGNOSIS: 1. Acute renal failure 2. Hyperkalemia   POST-OPERATIVE DIAGNOSIS: Same  SURGEON: Leotis Pain, MD  ASSISTANT(S): None  ANESTHESIA: local  ESTIMATED BLOOD LOSS: Minimal   FINDING(S): 1. None  SPECIMEN(S): None  INDICATIONS:  Patient is a 87 y.o.male who presents with renal failure and hyperkalemia which was severe. Emergency procedure was done as he could not provide consent  DESCRIPTION: After obtaining full informed written consent, the patient was laid flat in the bed. The right groin was sterilely prepped and draped in a sterile surgical field was created. The right femoral vein was visualized with ultrasound and found to be widely patent. It was then accessed under direct guidance without difficulty with a Seldinger needle and a permanent image was recorded. A J-wire was then placed. After skin nick and dilatation, a 30 cm triple lumen dialysis catheter was placed over the wire and the wire was removed. The lumens withdrew dark red nonpulsatile blood and flushed easily with sterile saline. The catheter was secured to the skin with 3 nylon sutures. Sterile dressing was placed.  COMPLICATIONS: None  CONDITION: Stable  Leotis Pain 07/21/2020 1:59 PM  This note was created with Dragon Medical transcription system. Any errors in dictation are purely unintentional.

## 2020-07-22 NOTE — ED Notes (Signed)
Dr. Tamala Julian attempted vas cath placement x1, unsuccessful. Patient tolerated well.

## 2020-07-23 ENCOUNTER — Inpatient Hospital Stay: Payer: Medicare Other

## 2020-07-23 DIAGNOSIS — R6520 Severe sepsis without septic shock: Secondary | ICD-10-CM

## 2020-07-23 DIAGNOSIS — U071 COVID-19: Secondary | ICD-10-CM

## 2020-07-23 DIAGNOSIS — N179 Acute kidney failure, unspecified: Secondary | ICD-10-CM | POA: Diagnosis not present

## 2020-07-23 DIAGNOSIS — N170 Acute kidney failure with tubular necrosis: Secondary | ICD-10-CM

## 2020-07-23 DIAGNOSIS — A419 Sepsis, unspecified organism: Secondary | ICD-10-CM | POA: Diagnosis not present

## 2020-07-23 DIAGNOSIS — F0391 Unspecified dementia with behavioral disturbance: Secondary | ICD-10-CM | POA: Diagnosis not present

## 2020-07-23 LAB — POTASSIUM
Potassium: 4.3 mmol/L (ref 3.5–5.1)
Potassium: 4.2 mmol/L (ref 3.5–5.1)

## 2020-07-23 LAB — PROTIME-INR
INR: 1.1 (ref 0.8–1.2)
Prothrombin Time: 14.2 seconds (ref 11.4–15.2)

## 2020-07-23 LAB — CBG MONITORING, ED
Glucose-Capillary: 117 mg/dL — ABNORMAL HIGH (ref 70–99)
Glucose-Capillary: 118 mg/dL — ABNORMAL HIGH (ref 70–99)
Glucose-Capillary: 127 mg/dL — ABNORMAL HIGH (ref 70–99)
Glucose-Capillary: 128 mg/dL — ABNORMAL HIGH (ref 70–99)
Glucose-Capillary: 163 mg/dL — ABNORMAL HIGH (ref 70–99)
Glucose-Capillary: 144 mg/dL — ABNORMAL HIGH (ref 70–99)

## 2020-07-23 LAB — CBC
HCT: 31 % — ABNORMAL LOW (ref 39.0–52.0)
Hemoglobin: 10.3 g/dL — ABNORMAL LOW (ref 13.0–17.0)
MCH: 27 pg (ref 26.0–34.0)
MCHC: 33.2 g/dL (ref 30.0–36.0)
MCV: 81.2 fL (ref 80.0–100.0)
Platelets: 583 10*3/uL — ABNORMAL HIGH (ref 150–400)
RBC: 3.82 MIL/uL — ABNORMAL LOW (ref 4.22–5.81)
RDW: 13.6 % (ref 11.5–15.5)
WBC: 15.9 10*3/uL — ABNORMAL HIGH (ref 4.0–10.5)
nRBC: 0 % (ref 0.0–0.2)

## 2020-07-23 LAB — HEPATITIS B SURFACE ANTIGEN: Hepatitis B Surface Ag: NONREACTIVE

## 2020-07-23 LAB — BASIC METABOLIC PANEL
Anion gap: 17 — ABNORMAL HIGH (ref 5–15)
BUN: 108 mg/dL — ABNORMAL HIGH (ref 8–23)
CO2: 27 mmol/L (ref 22–32)
Calcium: 9 mg/dL (ref 8.9–10.3)
Chloride: 102 mmol/L (ref 98–111)
Creatinine, Ser: 4.33 mg/dL — ABNORMAL HIGH (ref 0.61–1.24)
GFR, Estimated: 13 mL/min — ABNORMAL LOW (ref >60)
Glucose, Bld: 123 mg/dL — ABNORMAL HIGH (ref 70–99)
Potassium: 4.3 mmol/L (ref 3.5–5.1)
Sodium: 146 mmol/L — ABNORMAL HIGH (ref 135–145)

## 2020-07-23 LAB — PROCALCITONIN: Procalcitonin: 1.95 ng/mL

## 2020-07-23 LAB — CORTISOL-AM, BLOOD: Cortisol - AM: 40.2 ug/dL — ABNORMAL HIGH (ref 6.7–22.6)

## 2020-07-23 MED ORDER — LACTATED RINGERS IV SOLN: INTRAVENOUS | Status: DC

## 2020-07-23 MED ORDER — HYDRALAZINE HCL 20 MG/ML IJ SOLN: 10.0000 mg | Freq: Four times a day (QID) | INTRAMUSCULAR | Status: DC | PRN

## 2020-07-23 NOTE — Progress Notes (Signed)
PROGRESS NOTE    Shawn May  [id number] DOB: Apr 10, 1932 DOA: 07/19/2020 PCP: [facility]    Assessment & Plan:   Principal Problem:   Sepsis ([place]) Active Problems:   AKI (acute kidney injury) ([place])   Dementia ([place])   Type 2 diabetes mellitus with stage 3 chronic kidney disease ([place])   Acute lower UTI   Shawn May is a 87 y.o. 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  Per patient's wife he has had poor oral intake for the last couple of days and has been increasingly weak.  Patient states that he has not had any falls at home.  Per EMS his room air pulse oximetry at rest was in the 80s and improved following oxygen supplementation to the low 90s.   Severe Sepsis from a urinary source with complications of acute kidney injury POA  As evidenced by tachycardia, marked leukocytosis with a left shift, pyuria and elevated lactic acid level 2.1 Patient also noted to have worsening renal failure --Started patient on Rocephin 1 g IV daily --urine cx not obtained on admission Plan: --cont ceftriaxone --urine cx add on to prior collection  Acute on chronic kidney injury CKD3b --creatinine of 6.83 on admission compared to baseline of 2.46  --Patient followed by [facility] nephrology as an outpatient.  Suspect acute kidney injury due to severe volume depletion and dehydration.  CT for renal stone protocol was negative for hydronephrosis.   --Patient underwent urgent dialysis for significant azotemia and hyperkalemia yesterday.   Plan: --dialysis per nephrology --Hold quinapril and hydrochlorothiazide --switch MIVF from bicarb infusion to LR  Hyperkalemia, POA --improved with dialysis --dialysis per nephrology  COVID  infection --no respiratory symptoms, no hypoxia, CXR clear. Plan: --obtain CRP --will not start COVID treatment   Type 2 diabetes mellitus with stage III chronic kidney disease Uncontrolled --A1c 8.4 --hold home glipizide and Tradjenta --SSI [id number]  Acute metabolic encephalopathy underlying dementia --sepsis, uremia, covid encephalopathy... Plan: --Hold Namenda and Aricept --order tele sitter   DVT prophylaxis: Heparin SQ Code Status: DNR  Family Communication:  Status is: inpatient Dispo:   The patient is from: home Anticipated d/c is to: undetermined Anticipated d/c date is: > 3 days Patient currently is not medically ready to d/c due to: unresponsive, AKI starting dialysis, on IV abx   Subjective and Interval History:  Pt was unresponsive.  On room air.  Received dialysis.   Objective: Vitals:   07/23/20 0600 07/23/20 0837 07/23/20 1206 07/23/20 1430  BP: (!) 151/95 (!) 119/97 (!) 134/94 (!) 116/95  Pulse: (!) 109 (!) 117 99 (!) 102  Resp: '16 17 16 16  '$ Temp:      TempSrc:      SpO2: 100% 100% 100% 100%  Weight:      Height:        Intake/Output Summary (Last 24 hours) at 07/23/2020 1753 Last data filed at 07/23/2020 0616 Gross per 24 hour  Intake 2110.99 ml  Output 228 ml  Net 1882.99 ml   Filed Weights   07/19/2020 1056  Weight: 72.6 kg    Examination:   Constitutional: NAD, somnolent, not responsive CV: RRR. No cyanosis.   RESP: normal respiratory effort, on RA Extremities: No effusions, edema in BLE SKIN: warm, dry   Data  Reviewed: I have personally reviewed following labs and imaging studies  CBC: Recent Labs  Lab 07/19/2020 1058 07/23/20 0407  WBC 18.7* 15.9*  NEUTROABS 16.2*  --   HGB 13.4 10.3*  HCT 40.8 31.0*  MCV 81.9 81.2  PLT 824* [id number]*   Basic Metabolic Panel: Recent Labs  Lab 07/21/2020 1058 07/03/2020 1417 07/26/2020 1615 07/24/2020 2147 07/23/20 0229 07/23/20 0407 07/23/20 1215  NA 146*  --  148*  --   --  146*  --   K  7.1*   < > 5.5* 5.2* 4.3 4.3 4.2  CL 109  --  109  --   --  102  --   CO2 18*  --  16*  --   --  27  --   GLUCOSE 324*  --  211*  --   --  123*  --   BUN 167*  --  166*  --   --  108*  --   CREATININE 6.83*  --  6.46*  --   --  4.33*  --   CALCIUM 10.3  --  10.1  --   --  9.0  --   MG 3.3*  --   --   --   --   --   --    < > = values in this interval not displayed.   GFR: Estimated Creatinine Clearance: 11.8 mL/min (A) (by C-G formula based on SCr of 4.33 mg/dL (H)). Liver Function Tests: Recent Labs  Lab 07/20/2020 1058  AST 40  ALT 38  ALKPHOS 97  BILITOT 1.3*  PROT 9.0*  ALBUMIN 3.6   No results for input(s): LIPASE, AMYLASE in the last 168 hours. No results for input(s): AMMONIA in the last 168 hours. Coagulation Profile: Recent Labs  Lab 07/23/20 0407  INR 1.1   Cardiac Enzymes: No results for input(s): CKTOTAL, CKMB, CKMBINDEX, TROPONINI in the last 168 hours. BNP (last 3 results) No results for input(s): PROBNP in the last 8760 hours. HbA1C: Recent Labs    07/09/2020 1526  HGBA1C 8.4*   CBG: Recent Labs  Lab 07/23/20 0224 07/23/20 0405 07/23/20 0828 07/23/20 1203 07/23/20 1602  GLUCAP 117* 118* 127* 128* 163*   Lipid Profile: No results for input(s): CHOL, HDL, LDLCALC, TRIG, CHOLHDL, LDLDIRECT in the last 72 hours. Thyroid Function Tests: No results for input(s): TSH, T4TOTAL, FREET4, T3FREE, THYROIDAB in the last 72 hours. Anemia Panel: No results for input(s): VITAMINB12, FOLATE, FERRITIN, TIBC, IRON, RETICCTPCT in the last 72 hours. Sepsis Labs: Recent Labs  Lab 07/03/2020 1058 08/02/2020 1748 07/23/20 0407  PROCALCITON  --   --  1.95  LATICACIDVEN 2.1* 1.9  --     Recent Results (from the past 240 hour(s))  SARS Coronavirus 2 by RT PCR (hospital order, performed in [facility] hospital lab) Nasopharyngeal Nasopharyngeal Swab     Status: Abnormal   Collection Time: 07/26/2020  1:15 PM   Specimen: Nasopharyngeal Swab  Result Value Ref Range  Status   SARS Coronavirus 2 POSITIVE (A) NEGATIVE Final    Comment: RESULT CALLED TO, READ BACK BY AND VERIFIED WITH: ROBIN REGISTER AT 1453 ON 07/18/2020 BY SS (NOTE) SARS-CoV-2 target nucleic acids are DETECTED  SARS-CoV-2 RNA is generally detectable in upper respiratory specimens  during the acute phase of infection.  Positive results are indicative  of the presence of the identified virus, but do not rule out bacterial infection or co-infection with other pathogens not detected by the test.  Clinical  correlation with patient history and  other diagnostic information is necessary to determine patient infection status.  The expected result is negative.  Fact Sheet for Patients:   StrictlyIdeas.no   Fact Sheet for Healthcare Providers:   BankingDealers.co.za    This test is not yet approved or cleared by the [place] FDA and  has been authorized for detection and/or diagnosis of SARS-CoV-2 by FDA under an Emergency Use Authorization (EUA).  This EUA will remain in effect (meaning th is test can be used) for the duration of  the COVID-19 declaration under Section 564(b)(1) of the Act, 21 U.S.C. section 360-bbb-3(b)(1), unless the authorization is terminated or revoked sooner.  Performed at [facility], [address] [place], [place] [address]   Culture, blood (single)     Status: None (Preliminary result)   Collection Time: 07/25/2020  2:17 PM   Specimen: BLOOD  Result Value Ref Range Status   Specimen Description BLOOD RIGHT ANTECUBITAL  Final   Special Requests   Final    BOTTLES DRAWN AEROBIC AND ANAEROBIC Blood Culture adequate volume   Culture   Final    NO GROWTH < 24 HOURS Performed at [facility], [address] [place], [place] [address]    Report Status PENDING  Incomplete      Radiology Studies: CT HEAD WO CONTRAST  Result Date: 07/23/2020 CLINICAL DATA:  Initial evaluation for acute  neuro deficit, stroke suspected. EXAM: CT HEAD WITHOUT CONTRAST TECHNIQUE: Contiguous axial images were obtained from the base of the skull through the vertex without intravenous contrast. COMPARISON:  Previous MRI from 09/11/2018. FINDINGS: Brain: Moderately advanced age-related cerebral atrophy with chronic small vessel ischemic disease. No acute intracranial hemorrhage. No acute large vessel territory infarct. No mass lesion, midline shift or mass effect. No hydrocephalus or extra-axial fluid collection. Vascular: No hyperdense vessel. Calcified atherosclerosis present at skull base. Skull: Scalp soft tissues within normal limits.  Calvarium intact. Sinuses/Orbits: Globes and orbital soft tissues within normal limits. Paranasal sinuses are clear. No significant mastoid effusion. Other: None. IMPRESSION: 1. No acute intracranial abnormality. 2. Moderately advanced age-related cerebral atrophy with chronic small vessel ischemic disease. Electronically Signed   By: Jeannine Boga M.D.   On: 07/23/2020 00:43   DG Chest Portable 1 View  Result Date: 07/21/2020 CLINICAL DATA:  SOB and weak, eval infiltrates EXAM: PORTABLE CHEST 1 VIEW COMPARISON:  12/30/2019. FINDINGS: No focal consolidation. No pneumothorax or pleural effusion. Cardiomediastinal silhouette is within normal limits. No acute osseous abnormality. IMPRESSION: No focal airspace disease. Electronically Signed   By: Primitivo Gauze M.D.   On: 07/16/2020 11:31   CT RENAL STONE STUDY  Addendum Date: 07/23/2020   ADDENDUM REPORT: 07/30/2020 18:31 ADDENDUM: Right lower extremity central venous catheter with tip terminating in the infra hepatic IVC just above the renal veins. Electronically Signed   By: Donavan Foil M.D.   On: 07/24/2020 18:31   Result Date: 07/24/2020 CLINICAL DATA:  Short of breath week EXAM: CT ABDOMEN AND PELVIS WITHOUT CONTRAST TECHNIQUE: Multidetector CT imaging of the abdomen and pelvis was performed following the  standard protocol without IV contrast. COMPARISON:  Ultrasound 05/14/2009 FINDINGS: Lower chest: lung bases demonstrate mild lower lobe bronchial wall thickening with minimal dependent atelectasis. No consolidation or pleural effusion. Coronary vascular calcifications. Mild gynecomastia Hepatobiliary: No focal liver abnormality is seen. No gallstones, gallbladder wall thickening, or biliary dilatation. Pancreas: Unremarkable. No pancreatic ductal dilatation or surrounding inflammatory changes. Spleen: Normal in size without focal abnormality.  Adrenals/Urinary Tract: Adrenal glands are normal. Kidneys show no hydronephrosis. Thickened urinary bladder wall right-side predominant. Foley catheter and small amount of air within the bladder. Stomach/Bowel: Stomach is within normal limits. Appendix appears normal. No evidence of bowel wall thickening, distention, or inflammatory changes. Vascular/Lymphatic: Moderate aortic atherosclerosis. No aneurysm. No suspicious nodes. Reproductive: Markedly enlarged prostate with asymmetric bulging on the right side. Other: Negative for free air or free fluid. Small fat containing umbilical hernia Musculoskeletal: Degenerative changes. No acute or suspicious osseous abnormality IMPRESSION: 1. Negative for hydronephrosis or ureteral stone. 2. Markedly enlarged prostate gland with asymmetric bulging/questionable mass on the right side. 3. Thickened urinary bladder wall, slightly asymmetric on the right side. This could be due to cystitis or chronic obstruction with neoplastic etiology not entirely excluded. Aortic Atherosclerosis (ICD10-I70.0). Electronically Signed: By: Donavan Foil M.D. On: 07/25/2020 18:17     Scheduled Meds: . Chlorhexidine Gluconate Cloth  6 each Topical Q0600  . heparin  5,000 Units Subcutaneous Q8H  . insulin aspart  0-9 Units Subcutaneous Q4H   Continuous Infusions: . cefTRIAXone (ROCEPHIN)  IV Stopped (07/29/2020 2358)  .  sodium bicarbonate  (isotonic) infusion in sterile water 125 mL/hr at 07/23/20 0024     LOS: 1 day     Enzo Bi, MD [facility] If 7PM-7AM, please contact night-coverage 07/23/2020, 5:53 PM

## 2020-07-23 NOTE — ED Notes (Signed)
Pt respirations remain labored and irregular.

## 2020-07-23 NOTE — Consult Note (Signed)
[facility] CONSULT NOTE    Date: 07/23/2020                  Patient Name:  Shawn May  MRN: [id number]  DOB: 1932/02/07  Age / Sex: 87 y.o., male         PCP: [facility] [place]                 Service Requesting Consult:  Hospitalist                 Reason for Consult:  Acute kidney injury, chronic kidney disease stage IIIb baseline EGFR 31            History of Present Illness: Patient is a 87 y.o. male with a PMHx of dementia, diabetes mellitus type 2, hyperlipidemia, hypertension, chronic kidney disease stage IIIb baseline EGFR 31, who was admitted to [facility] on 07/29/2020 for evaluation of shortness of breath and weakness.  Patient unfortunately unable to provide any history and history obtained through chart review.  Patient came in with severe acute kidney injury with a BUN of 167, creatinine 6.83.  He also had evidence of hyperkalemia with a serum potassium of 7.1.  Patient did undergo urgent dialysis yesterday.  Potassium now down to 4.2.  Azotemia also improved with BUN down to 108 and creatinine of 4.3.  In addition patient found to be COVID-19 positive.  Patient was followed by [facility] nephrology as an outpatient and had an appointment with them approximately 6 months ago.  At that time his EGFR was 31.   Medications: Outpatient medications: (Not in a hospital admission)   Current medications: Current Facility-Administered Medications  Medication Dose Route Frequency Provider Last Rate Last Admin  . acetaminophen (TYLENOL) tablet 650 mg  650 mg Oral Q6H PRN Agbata, Tochukwu, MD       Or  . acetaminophen (TYLENOL) suppository 650 mg  650 mg Rectal Q6H PRN Agbata, Tochukwu, MD      . cefTRIAXone (ROCEPHIN) 1 g in sodium chloride 0.9 % 100 mL IVPB  1 g Intravenous Q24H Agbata, Tochukwu, MD   Stopped at 07/21/2020 2358  . Chlorhexidine Gluconate Cloth 2 % PADS 6 each  6 each Topical Q0600 Rotha Cassels, MD      . haloperidol lactate  (HALDOL) injection 1 mg  1 mg Intravenous Q6H PRN Agbata, Tochukwu, MD   1 mg at 07/21/2020 1445  . heparin injection 5,000 Units  5,000 Units Subcutaneous Q8H Agbata, Tochukwu, MD   5,000 Units at 07/23/20 0601  . hydrALAZINE (APRESOLINE) injection 10 mg  10 mg Intravenous Q6H PRN Lang Snow, NP      . insulin aspart (novoLOG) injection 0-9 Units  0-9 Units Subcutaneous Q4H Agbata, Tochukwu, MD   1 Units at 07/23/20 1204  . ondansetron (ZOFRAN) tablet 4 mg  4 mg Oral Q6H PRN Agbata, Tochukwu, MD       Or  . ondansetron (ZOFRAN) injection 4 mg  4 mg Intravenous Q6H PRN Agbata, Tochukwu, MD      . sodium bicarbonate 150 mEq in sterile water 1,000 mL infusion   Intravenous Continuous Agbata, Tochukwu, MD 125 mL/hr at 07/23/20 0024 Infusion Verify at 07/23/20 0024   Current Outpatient Medications  Medication Sig Dispense Refill  . aspirin 81 MG chewable tablet Chew 1 tablet (81 mg total) by mouth daily. 30 tablet 2  . atorvastatin (LIPITOR) 40 MG tablet Take 40 mg by mouth daily.    Marland Kitchen  cholecalciferol (VITAMIN D3) 25 MCG (1000 UT) tablet Take 2,000 Units by mouth daily.    Marland Kitchen donepezil (ARICEPT) 5 MG tablet Take 5 mg by mouth 2 (two) times daily.    Marland Kitchen glipiZIDE (GLUCOTROL XL) 10 MG 24 hr tablet Take 10 mg by mouth 2 (two) times daily.    . hydrochlorothiazide (HYDRODIURIL) 25 MG tablet Take 25 mg by mouth daily.    Marland Kitchen linagliptin (TRADJENTA) 5 MG TABS tablet Take 5 mg by mouth daily.    . memantine (NAMENDA) 5 MG tablet Take 5 mg by mouth 2 (two) times daily.    . quinapril (ACCUPRIL) 10 MG tablet Take 10 mg by mouth daily.    . sodium bicarbonate 650 MG tablet Take by mouth. (Patient not taking: No sig reported)        Allergies: No Known Allergies    Past Medical History: Past Medical History:  Diagnosis Date  . Dementia ([place])   . Diabetes mellitus without complication ([place])   . Hyperlipidemia   . Hypertension   . Renal disorder      Past Surgical History: History  reviewed. No pertinent surgical history.   Family History: History reviewed. No pertinent family history.   Social History: Social History   Socioeconomic History  . Marital status: Married    Spouse name: Not on file  . Number of children: Not on file  . Years of education: Not on file  . Highest education level: Not on file  Occupational History  . Not on file  Tobacco Use  . Smoking status: Former Research scientist (life sciences)  . Smokeless tobacco: Never Used  Substance and Sexual Activity  . Alcohol use: Not Currently  . Drug use: Not Currently  . Sexual activity: Not on file  Other Topics Concern  . Not on file  Social History Narrative  . Not on file   Social Determinants of Health   Financial Resource Strain: Not on file  Food Insecurity: Not on file  Transportation Needs: Not on file  Physical Activity: Not on file  Stress: Not on file  Social Connections: Not on file  Intimate Partner Violence: Not on file     Review of Systems: Patient unable to provide review of systems due to altered mental status.  Vital Signs: Blood pressure (!) 134/94, pulse 99, temperature 97.7 F (36.5 C), temperature source Axillary, resp. rate 16, height $RemoveBe'5\' 8"'PAZjqDnnv$  (1.727 m), weight 72.6 kg, SpO2 100 %.  Weight trends: Filed Weights   07/11/2020 1056  Weight: 72.6 kg     Physical Exam: General:  No acute distress, moaning in bed  Head:  Normocephalic, atraumatic. Moist oral mucosal membranes  Eyes:  Anicteric  Neck:  Supple  Lungs:   Clear to auscultation, normal effort  Heart:  S1S2 no rubs  Abdomen:   Soft, nontender, bowel sounds present  Extremities:  No peripheral edema.  Neurologic:  Arousable, moaning in bed  Skin:  No acute rash  Access:  Right femoral temporary dialysis catheter    Lab results: Basic Metabolic Panel: Recent Labs  Lab 07/08/2020 1058 08/02/2020 1417 07/09/2020 1615 07/29/2020 2147 07/23/20 0229 07/23/20 0407 07/23/20 1215  NA 146*  --  148*  --   --  146*  --   K  7.1*   < > 5.5*   < > 4.3 4.3 4.2  CL 109  --  109  --   --  102  --   CO2 18*  --  16*  --   --  27  --   GLUCOSE 324*  --  211*  --   --  123*  --   BUN 167*  --  166*  --   --  108*  --   CREATININE 6.83*  --  6.46*  --   --  4.33*  --   CALCIUM 10.3  --  10.1  --   --  9.0  --   MG 3.3*  --   --   --   --   --   --    < > = values in this interval not displayed.    Liver Function Tests: Recent Labs  Lab 08/01/2020 1058  AST 40  ALT 38  ALKPHOS 97  BILITOT 1.3*  PROT 9.0*  ALBUMIN 3.6   No results for input(s): LIPASE, AMYLASE in the last 168 hours. No results for input(s): AMMONIA in the last 168 hours.  CBC: Recent Labs  Lab 07/26/2020 1058 07/23/20 0407  WBC 18.7* 15.9*  NEUTROABS 16.2*  --   HGB 13.4 10.3*  HCT 40.8 31.0*  MCV 81.9 81.2  PLT 824* 583*    Cardiac Enzymes: No results for input(s): CKTOTAL, CKMB, CKMBINDEX, TROPONINI in the last 168 hours.  BNP: Invalid input(s): POCBNP  CBG: Recent Labs  Lab 07/07/2020 2157 07/23/20 0224 07/23/20 0405 07/23/20 0828 07/23/20 1203  GLUCAP 181* 117* 118* 127* 128*    Microbiology: Results for orders placed or performed during the hospital encounter of 07/17/2020  SARS Coronavirus 2 by RT PCR (hospital order, performed in [facility] hospital lab) Nasopharyngeal Nasopharyngeal Swab     Status: Abnormal   Collection Time: 07/16/2020  1:15 PM   Specimen: Nasopharyngeal Swab  Result Value Ref Range Status   SARS Coronavirus 2 POSITIVE (A) NEGATIVE Final    Comment: RESULT CALLED TO, READ BACK BY AND VERIFIED WITH: ROBIN REGISTER AT 1453 ON 07/14/2020 BY SS (NOTE) SARS-CoV-2 target nucleic acids are DETECTED  SARS-CoV-2 RNA is generally detectable in upper respiratory specimens  during the acute phase of infection.  Positive results are indicative  of the presence of the identified virus, but do not rule out bacterial infection or co-infection with other pathogens not detected by the test.  Clinical correlation  with patient history and  other diagnostic information is necessary to determine patient infection status.  The expected result is negative.  Fact Sheet for Patients:   StrictlyIdeas.no   Fact Sheet for Healthcare Providers:   BankingDealers.co.za    This test is not yet approved or cleared by the [place] FDA and  has been authorized for detection and/or diagnosis of SARS-CoV-2 by FDA under an Emergency Use Authorization (EUA).  This EUA will remain in effect (meaning th is test can be used) for the duration of  the COVID-19 declaration under Section 564(b)(1) of the Act, 21 U.S.C. section 360-bbb-3(b)(1), unless the authorization is terminated or revoked sooner.  Performed at [facility], [address] [place], [place] [address]   Culture, blood (single)     Status: None (Preliminary result)   Collection Time: 07/08/2020  2:17 PM   Specimen: BLOOD  Result Value Ref Range Status   Specimen Description BLOOD RIGHT ANTECUBITAL  Final   Special Requests   Final    BOTTLES DRAWN AEROBIC AND ANAEROBIC Blood Culture adequate volume   Culture   Final    NO GROWTH < 24 HOURS Performed at [facility], [address], [place], [place] [address]  Report Status PENDING  Incomplete    Coagulation Studies: Recent Labs    07/23/20 0407  LABPROT 14.2  INR 1.1    Urinalysis: Recent Labs    07/16/2020 1417  COLORURINE AMBER*  LABSPEC 1.017  PHURINE 5.0  GLUCOSEU NEGATIVE  HGBUR MODERATE*  BILIRUBINUR NEGATIVE  KETONESUR NEGATIVE  PROTEINUR 30*  NITRITE NEGATIVE  LEUKOCYTESUR LARGE*      Imaging: CT HEAD WO CONTRAST  Result Date: 07/23/2020 CLINICAL DATA:  Initial evaluation for acute neuro deficit, stroke suspected. EXAM: CT HEAD WITHOUT CONTRAST TECHNIQUE: Contiguous axial images were obtained from the base of the skull through the vertex without intravenous contrast. COMPARISON:  Previous MRI from  09/11/2018. FINDINGS: Brain: Moderately advanced age-related cerebral atrophy with chronic small vessel ischemic disease. No acute intracranial hemorrhage. No acute large vessel territory infarct. No mass lesion, midline shift or mass effect. No hydrocephalus or extra-axial fluid collection. Vascular: No hyperdense vessel. Calcified atherosclerosis present at skull base. Skull: Scalp soft tissues within normal limits.  Calvarium intact. Sinuses/Orbits: Globes and orbital soft tissues within normal limits. Paranasal sinuses are clear. No significant mastoid effusion. Other: None. IMPRESSION: 1. No acute intracranial abnormality. 2. Moderately advanced age-related cerebral atrophy with chronic small vessel ischemic disease. Electronically Signed   By: Rise Mu M.D.   On: 07/23/2020 00:43   DG Chest Portable 1 View  Result Date: 07/07/2020 CLINICAL DATA:  SOB and weak, eval infiltrates EXAM: PORTABLE CHEST 1 VIEW COMPARISON:  12/30/2019. FINDINGS: No focal consolidation. No pneumothorax or pleural effusion. Cardiomediastinal silhouette is within normal limits. No acute osseous abnormality. IMPRESSION: No focal airspace disease. Electronically Signed   By: Stana Bunting M.D.   On: 07/03/2020 11:31   CT RENAL STONE STUDY  Addendum Date: 08/01/2020   ADDENDUM REPORT: 07/26/2020 18:31 ADDENDUM: Right lower extremity central venous catheter with tip terminating in the infra hepatic IVC just above the renal veins. Electronically Signed   By: Jasmine Pang M.D.   On: 08/01/2020 18:31   Result Date: 07/03/2020 CLINICAL DATA:  Short of breath week EXAM: CT ABDOMEN AND PELVIS WITHOUT CONTRAST TECHNIQUE: Multidetector CT imaging of the abdomen and pelvis was performed following the standard protocol without IV contrast. COMPARISON:  Ultrasound 05/14/2009 FINDINGS: Lower chest: lung bases demonstrate mild lower lobe bronchial wall thickening with minimal dependent atelectasis. No consolidation or  pleural effusion. Coronary vascular calcifications. Mild gynecomastia Hepatobiliary: No focal liver abnormality is seen. No gallstones, gallbladder wall thickening, or biliary dilatation. Pancreas: Unremarkable. No pancreatic ductal dilatation or surrounding inflammatory changes. Spleen: Normal in size without focal abnormality. Adrenals/Urinary Tract: Adrenal glands are normal. Kidneys show no hydronephrosis. Thickened urinary bladder wall right-side predominant. Foley catheter and small amount of air within the bladder. Stomach/Bowel: Stomach is within normal limits. Appendix appears normal. No evidence of bowel wall thickening, distention, or inflammatory changes. Vascular/Lymphatic: Moderate aortic atherosclerosis. No aneurysm. No suspicious nodes. Reproductive: Markedly enlarged prostate with asymmetric bulging on the right side. Other: Negative for free air or free fluid. Small fat containing umbilical hernia Musculoskeletal: Degenerative changes. No acute or suspicious osseous abnormality IMPRESSION: 1. Negative for hydronephrosis or ureteral stone. 2. Markedly enlarged prostate gland with asymmetric bulging/questionable mass on the right side. 3. Thickened urinary bladder wall, slightly asymmetric on the right side. This could be due to cystitis or chronic obstruction with neoplastic etiology not entirely excluded. Aortic Atherosclerosis (ICD10-I70.0). Electronically Signed: By: Jasmine Pang M.D. On: 07/03/2020 18:17      Assessment & Plan: Pt is a  87 y.o. male with a PMHx of dementia, diabetes mellitus type 2, hyperlipidemia, hypertension, chronic kidney disease stage IIIb baseline EGFR 31, who was admitted to [facility] on 07/10/2020 for evaluation of shortness of breath and weakness.  1.  Acute kidney injury/chronic kidney disease stage IIIb baseline EGFR 31.  Patient followed by [facility] nephrology as an outpatient.  Suspect acute kidney injury now due to severe volume depletion and dehydration.  CT for  renal stone protocol was negative for hydronephrosis.  Patient underwent urgent dialysis for significant azotemia and hyperkalemia yesterday.  Reassess for additional need of dialysis tomorrow.  2.  Hyperkalemia.  Serum potassium markedly improved with dialysis yesterday.  Continue to monitor serum potassium.  3.  Anemia of chronic kidney disease.  Hemoglobin currently 10.3.  No indication for Procrit at the moment.

## 2020-07-23 NOTE — ED Notes (Signed)
Pharmacy contacted in request for new bag of sodium bicarbonate

## 2020-07-23 NOTE — ED Notes (Signed)
Pt continues to be nonverbal and moans continuously.

## 2020-07-23 NOTE — ED Notes (Signed)
Pt lying in bed resting with eyes closed; RR even and unlabored on RA --- pt aroused with physical/tactile stimulation - pt nonverbal.  Cardiac monitoring and continuous pulse ox maintained. VSS.  Current BGL 144.  R femoral HD catheter dressing dry and intact.  Indwelling foley cath draining cloudy yellow urine via gravity.  Pt wearing adult brief- no stool noted. LR infusing '@75ml'$ /hr without complication.  Will monitor for acute changes and maintain plan of care.

## 2020-07-24 DIAGNOSIS — N179 Acute kidney failure, unspecified: Secondary | ICD-10-CM | POA: Diagnosis not present

## 2020-07-24 DIAGNOSIS — R6520 Severe sepsis without septic shock: Secondary | ICD-10-CM | POA: Diagnosis not present

## 2020-07-24 DIAGNOSIS — A419 Sepsis, unspecified organism: Secondary | ICD-10-CM | POA: Diagnosis not present

## 2020-07-24 DIAGNOSIS — N390 Urinary tract infection, site not specified: Secondary | ICD-10-CM

## 2020-07-24 LAB — CBG MONITORING, ED
Glucose-Capillary: 128 mg/dL — ABNORMAL HIGH (ref 70–99)
Glucose-Capillary: 130 mg/dL — ABNORMAL HIGH (ref 70–99)
Glucose-Capillary: 164 mg/dL — ABNORMAL HIGH (ref 70–99)
Glucose-Capillary: 177 mg/dL — ABNORMAL HIGH (ref 70–99)
Glucose-Capillary: 189 mg/dL — ABNORMAL HIGH (ref 70–99)
Glucose-Capillary: 218 mg/dL — ABNORMAL HIGH (ref 70–99)
Glucose-Capillary: 62 mg/dL — ABNORMAL LOW (ref 70–99)

## 2020-07-24 LAB — BASIC METABOLIC PANEL
Anion gap: 21 — ABNORMAL HIGH (ref 5–15)
BUN: 90 mg/dL — ABNORMAL HIGH (ref 8–23)
CO2: 25 mmol/L (ref 22–32)
Calcium: 9.2 mg/dL (ref 8.9–10.3)
Chloride: 105 mmol/L (ref 98–111)
Creatinine, Ser: 3.1 mg/dL — ABNORMAL HIGH (ref 0.61–1.24)
GFR, Estimated: 19 mL/min — ABNORMAL LOW (ref >60)
Glucose, Bld: 134 mg/dL — ABNORMAL HIGH (ref 70–99)
Potassium: 3.8 mmol/L (ref 3.5–5.1)
Sodium: 151 mmol/L — ABNORMAL HIGH (ref 135–145)

## 2020-07-24 LAB — CBC
HCT: 32.4 % — ABNORMAL LOW (ref 39.0–52.0)
Hemoglobin: 10.9 g/dL — ABNORMAL LOW (ref 13.0–17.0)
MCH: 27.7 pg (ref 26.0–34.0)
MCHC: 33.6 g/dL (ref 30.0–36.0)
MCV: 82.2 fL (ref 80.0–100.0)
Platelets: 539 10*3/uL — ABNORMAL HIGH (ref 150–400)
RBC: 3.94 MIL/uL — ABNORMAL LOW (ref 4.22–5.81)
RDW: 13.8 % (ref 11.5–15.5)
WBC: 18.1 10*3/uL — ABNORMAL HIGH (ref 4.0–10.5)
nRBC: 0 % (ref 0.0–0.2)

## 2020-07-24 LAB — MAGNESIUM: Magnesium: 2.1 mg/dL (ref 1.7–2.4)

## 2020-07-24 LAB — C-REACTIVE PROTEIN: CRP: 24.4 mg/dL — ABNORMAL HIGH (ref <1.0)

## 2020-07-24 LAB — HEPATITIS B SURFACE ANTIBODY, QUANTITATIVE: Hep B S AB Quant (Post): <3.1 m[IU]/mL — ABNORMAL LOW (ref >9.9)

## 2020-07-24 MED ORDER — DEXTROSE 5 % IV SOLN: INTRAVENOUS | Status: DC

## 2020-07-24 MED ORDER — SODIUM CHLORIDE 0.45 % IV SOLN: INTRAVENOUS | Status: DC

## 2020-07-24 MED ORDER — DEXTROSE 250 MG/ML IV SOLN
12.5000 g | Freq: Once | INTRAVENOUS | Status: AC
  Administered 2020-07-24: 12.5 g via INTRAVENOUS
  Filled 2020-07-24: qty 50

## 2020-07-24 MED ORDER — INSULIN ASPART 100 UNIT/ML ~~LOC~~ SOLN
0.0000 [IU] | SUBCUTANEOUS | Status: DC
  Administered 2020-07-25 (×2): 5 [IU] via SUBCUTANEOUS
  Administered 2020-07-25: 03:00:00 2 [IU] via SUBCUTANEOUS
  Administered 2020-07-25: 5 [IU] via SUBCUTANEOUS
  Filled 2020-07-24 (×4): qty 1

## 2020-07-24 NOTE — ED Notes (Signed)
Pt repositioned in bed with Dena RN

## 2020-07-24 NOTE — ED Notes (Signed)
Pt moaning constantly, not answering questions appropriately, unable to answer if in pain. Dr Billie Ruddy notified, no paint meds ordered at this time.

## 2020-07-24 NOTE — Progress Notes (Signed)
[facility]  ROUNDING NOTE   Subjective:   UOP 964m  Continues to have poor oral intake.   Objective:  Vital signs in last 24 hours:  Pulse Rate:  [79-111] 96 (01/22 1400) Resp:  [14-24] 17 (01/22 1400) BP: (105-164)/(76-93) 140/93 (01/22 1400) SpO2:  [100 %] 100 % (01/22 1400)  Weight change:  Filed Weights   07/09/2020 1056  Weight: 72.6 kg    Intake/Output: I/O last 3 completed shifts: In: 2111 [I.V.:2111] Out: 1178 [Urine:1600]   Intake/Output this shift:  No intake/output data recorded.  Physical Exam: General: NAD, laying in bed  Head: Normocephalic, atraumatic. Moist oral mucosal membranes  Eyes: Anicteric, PERRL  Neck: Supple, trachea midline  Lungs:  Clear to auscultation  Heart: Regular rate and rhythm  Abdomen:  Soft, nontender,   Extremities:  no peripheral edema.  Neurologic: confused  Skin: No lesions        Basic Metabolic Panel: Recent Labs  Lab 07/18/2020 1058 07/16/2020 1417 07/29/2020 1615 07/03/2020 2147 07/23/20 0229 07/23/20 0407 07/23/20 1215 07/24/20 0500  NA 146*  --  148*  --   --  146*  --  151*  K 7.1*   < > 5.5* 5.2* 4.3 4.3 4.2 3.8  CL 109  --  109  --   --  102  --  105  CO2 18*  --  16*  --   --  27  --  25  GLUCOSE 324*  --  211*  --   --  123*  --  134*  BUN 167*  --  166*  --   --  108*  --  90*  CREATININE 6.83*  --  6.46*  --   --  4.33*  --  3.10*  CALCIUM 10.3  --  10.1  --   --  9.0  --  9.2  MG 3.3*  --   --   --   --   --   --  2.1   < > = values in this interval not displayed.    Liver Function Tests: Recent Labs  Lab 07/10/2020 1058  AST 40  ALT 38  ALKPHOS 97  BILITOT 1.3*  PROT 9.0*  ALBUMIN 3.6   No results for input(s): LIPASE, AMYLASE in the last 168 hours. No results for input(s): AMMONIA in the last 168 hours.  CBC: Recent Labs  Lab 07/19/2020 1058 07/23/20 0407 07/24/20 0500  WBC 18.7* 15.9* 18.1*  NEUTROABS 16.2*  --   --   HGB 13.4 10.3* 10.9*  HCT 40.8 31.0* 32.4*  MCV  81.9 81.2 82.2  PLT 824* 583* 539*    Cardiac Enzymes: No results for input(s): CKTOTAL, CKMB, CKMBINDEX, TROPONINI in the last 168 hours.  BNP: Invalid input(s): POCBNP  CBG: Recent Labs  Lab 07/24/20 0504 07/24/20 0547 07/24/20 0550 07/24/20 0730 07/24/20 1104  GLUCAP 62* 218* 189* 177* 130*    Microbiology: Results for orders placed or performed during the hospital encounter of 07/23/2020  SARS Coronavirus 2 by RT PCR (hospital order[facility]) Nasopharyngeal Nasopharyngeal Swab     Status: Abnormal   Collection Time: 07/20/2020  1:15 PM   Specimen: Nasopharyngeal Swab  Result Value Ref Range Status   SARS Coronavirus 2 POSITIVE (A) NEGATIVE Final    Comment: RESULT CALLED TO, READ BACK BY AND VERIFIED WITH: ROBIN REGISTER AT 1453 ON 07/27/2020 BY SS (NOTE) SARS-CoV-2 target nucleic acids are DETECTED  SARS-CoV-2 RNA is  generally detectable in upper respiratory specimens  during the acute phase of infection.  Positive results are indicative  of the presence of the identified virus, but do not rule out bacterial infection or co-infection with other pathogens not detected by the test.  Clinical correlation with patient history and  other diagnostic information is necessary to determine patient infection status.  The expected result is negative.  Fact Sheet for Patients:   StrictlyIdeas.no   Fact Sheet for Healthcare Providers:   BankingDealers.co.za    This test is not yet approved or cleared by the [place] FDA and  has been authorized for detection and/or diagnosis of SARS-CoV-2 by FDA under an Emergency Use Authorization (EUA).  This EUA will remain in effect (meaning th is test can be used) for the duration of  the COVID-19 declaration under Section 564(b)(1) of the Act, 21 U.S.C. section 360-bbb-3(b)(1), unless the authorization is terminated or revoked sooner.  Performed at [facility], [address] [place], [place] [address]   Culture, blood (single)     Status: None (Preliminary result)   Collection Time: 07/25/2020  2:17 PM   Specimen: BLOOD  Result Value Ref Range Status   Specimen Description BLOOD RIGHT ANTECUBITAL  Final   Special Requests   Final    BOTTLES DRAWN AEROBIC AND ANAEROBIC Blood Culture adequate volume   Culture   Final    NO GROWTH 2 DAYS Performed at [facility], [address] [place], [place] [address]    Report Status PENDING  Incomplete  Urine Culture     Status: Abnormal (Preliminary result)   Collection Time: 07/16/2020  2:17 PM   Specimen: Urine, Random  Result Value Ref Range Status   Specimen Description   Final    URINE, RANDOM Performed at [facility] [address] [place], [place] [address]    Special Requests   Final    NONE Performed at [facility], [address] [place], [place] [address]    Culture (A)  Final    >=100,000 COLONIES/mL ESCHERICHIA COLI SUSCEPTIBILITIES TO FOLLOW Performed at [facility], [place] [address], [place],  [address]    Report Status PENDING  Incomplete    Coagulation Studies: Recent Labs    07/23/20 0407  LABPROT 14.2  INR 1.1    Urinalysis: Recent Labs    07/13/2020 1417  COLORURINE AMBER*  LABSPEC 1.017  PHURINE 5.0  GLUCOSEU NEGATIVE  HGBUR MODERATE*  BILIRUBINUR NEGATIVE  KETONESUR NEGATIVE  PROTEINUR 30*  NITRITE NEGATIVE  LEUKOCYTESUR LARGE*      Imaging: CT HEAD WO CONTRAST  Result Date: 07/23/2020 CLINICAL DATA:  Initial evaluation for acute neuro deficit, stroke suspected. EXAM: CT HEAD WITHOUT CONTRAST TECHNIQUE: Contiguous axial images were obtained from the base of the skull through the vertex without intravenous contrast. COMPARISON:  Previous MRI from 09/11/2018. FINDINGS: Brain: Moderately advanced age-related cerebral atrophy with chronic small vessel ischemic disease. No acute intracranial  hemorrhage. No acute large vessel territory infarct. No mass lesion, midline shift or mass effect. No hydrocephalus or extra-axial fluid collection. Vascular: No hyperdense vessel. Calcified atherosclerosis present at skull base. Skull: Scalp soft tissues within normal limits.  Calvarium intact. Sinuses/Orbits: Globes and orbital soft tissues within normal limits. Paranasal sinuses are clear. No significant mastoid effusion. Other: None. IMPRESSION: 1. No acute intracranial abnormality. 2. Moderately advanced age-related cerebral atrophy with chronic small vessel ischemic disease. Electronically Signed   By: Pincus Badder.D.  On: 07/23/2020 00:43   CT RENAL STONE STUDY  Addendum Date: 07/28/2020   ADDENDUM REPORT: 07/27/2020 18:31 ADDENDUM: Right lower extremity central venous catheter with tip terminating in the infra hepatic IVC just above the renal veins. Electronically Signed   By: Donavan Foil M.D.   On: 07/31/2020 18:31   Result Date: 07/21/2020 CLINICAL DATA:  Short of breath week EXAM: CT ABDOMEN AND PELVIS WITHOUT CONTRAST TECHNIQUE: Multidetector CT imaging of the abdomen and pelvis was performed following the standard protocol without IV contrast. COMPARISON:  Ultrasound 05/14/2009 FINDINGS: Lower chest: lung bases demonstrate mild lower lobe bronchial wall thickening with minimal dependent atelectasis. No consolidation or pleural effusion. Coronary vascular calcifications. Mild gynecomastia Hepatobiliary: No focal liver abnormality is seen. No gallstones, gallbladder wall thickening, or biliary dilatation. Pancreas: Unremarkable. No pancreatic ductal dilatation or surrounding inflammatory changes. Spleen: Normal in size without focal abnormality. Adrenals/Urinary Tract: Adrenal glands are normal. Kidneys show no hydronephrosis. Thickened urinary bladder wall right-side predominant. Foley catheter and small amount of air within the bladder. Stomach/Bowel: Stomach is within normal limits.  Appendix appears normal. No evidence of bowel wall thickening, distention, or inflammatory changes. Vascular/Lymphatic: Moderate aortic atherosclerosis. No aneurysm. No suspicious nodes. Reproductive: Markedly enlarged prostate with asymmetric bulging on the right side. Other: Negative for free air or free fluid. Small fat containing umbilical hernia Musculoskeletal: Degenerative changes. No acute or suspicious osseous abnormality IMPRESSION: 1. Negative for hydronephrosis or ureteral stone. 2. Markedly enlarged prostate gland with asymmetric bulging/questionable mass on the right side. 3. Thickened urinary bladder wall, slightly asymmetric on the right side. This could be due to cystitis or chronic obstruction with neoplastic etiology not entirely excluded. Aortic Atherosclerosis (ICD10-I70.0). Electronically Signed: By: Donavan Foil M.D. On: 07/30/2020 18:17     Medications:   . sodium chloride 75 mL/hr at 07/24/20 1010  . cefTRIAXone (ROCEPHIN)  IV Stopped (07/23/20 2303)   . Chlorhexidine Gluconate Cloth  6 each Topical Q0600  . heparin  5,000 Units Subcutaneous Q8H  . insulin aspart  0-9 Units Subcutaneous Q4H   acetaminophen **OR** acetaminophen, haloperidol lactate, hydrALAZINE, ondansetron **OR** ondansetron (ZOFRAN) IV  Assessment/ Plan:  Mr. Shawn May is a 87 y.o. black male with dementia, diabetes mellitus type 2, hyperlipidemia, hypertension, who was admitted to [facility] on 07/10/2020 for evaluation of shortness of breath and weakness.  1.  Acute kidney injury on chronic kidney disease stage IV with proteinuria: baseline creatinine of 2.19, GFR of 26 on 12/25/2019.  Followed by [facility] nephrology as an outpatient.   Suspect acute kidney injury now due to severe volume depletion and dehydration.   Emergent hemodialysis on admission.  Chronic kidney disease secondary to diabetic nephropathy - no indication for further dialysis. Nonoliguric urine output.   2.  Hypernatremia:  secondary to free water deficit.  - Start dextrose infusion  3.  Anemia of chronic kidney disease.  Hemoglobin 10.9 - stable.    LOS: 2 Shawn May 1/22/20224:07 PM

## 2020-07-24 NOTE — Progress Notes (Signed)
PROGRESS NOTE    Shawn May  [id number] DOB: Oct 27, 1931 DOA: 07/03/2020 PCP: [facility]    Assessment & Plan:   Principal Problem:   Sepsis ([place]) Active Problems:   AKI (acute kidney injury) ([place])   Dementia ([place])   Type 2 diabetes mellitus with stage 3 chronic kidney disease ([place])   Acute lower UTI   Shawn May is a 87 y.o. 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  Per patient's wife he has had poor oral intake for the last couple of days and has been increasingly weak.  Patient states that he has not had any falls at home.  Per EMS his room air pulse oximetry at rest was in the 80s and improved following oxygen supplementation to the low 90s.   Severe Sepsis from a urinary source with complications of acute kidney injury POA  As evidenced by tachycardia, marked leukocytosis with a left shift, pyuria and elevated lactic acid level 2.1 Patient also noted to have worsening renal failure --Started patient on Rocephin 1 g IV daily --urine cx added on Plan: --cont ceftriaxone pending urine cx results  Acute on chronic kidney injury CKD3b --creatinine of 6.83 on admission compared to baseline of 2.46  --Patient followed by [facility] nephrology as an outpatient.  Suspect acute kidney injury due to severe volume depletion and dehydration.  CT for renal stone protocol was negative for hydronephrosis.   --Patient underwent urgent dialysis for significant azotemia and hyperkalemia yesterday.   Plan: --dialysis per nephrology --Hold quinapril and hydrochlorothiazide --cont D5'@50'$ , per nephrology  Hyperkalemia, POA --improved with dialysis --dialysis per nephrology  COVID infection --no respiratory symptoms, no  hypoxia, CXR clear. --CRP 24.4 on presentation Plan: --No need to start Remdesivir  Type 2 diabetes mellitus with stage III chronic kidney disease Uncontrolled --A1c 8.4 --hold home glipizide and Tradjenta --SSI [id number]  Acute metabolic encephalopathy underlying dementia --sepsis, uremia, covid encephalopathy... Plan: --Hold Namenda and Aricept --order tele sitter  Hypernatremia --Na 146 on presentation, worsened to 151 despite being on hypotonic infusion Plan: --cont D5'@50'$ , per nephrology   DVT prophylaxis: Heparin SQ Code Status: DNR  Family Communication:  Status is: inpatient Dispo:   The patient is from: home Anticipated d/c is to: undetermined Anticipated d/c date is: > 3 days Patient currently is not medically ready to d/c due to: unresponsive, AKI starting dialysis, on IV abx   Subjective and Interval History:  Pt continued to be unresponsive.  Making urine.     Objective: Vitals:   07/24/20 1330 07/24/20 1345 07/24/20 1359 07/24/20 1400  BP:   (!) 140/93 (!) 140/93  Pulse: (!) 103 92 96 96  Resp:    17  Temp:      TempSrc:      SpO2: 100% 100% 100% 100%  Weight:      Height:        Intake/Output Summary (Last 24 hours) at 07/24/2020 1552 Last data filed at 07/23/2020 1818 Gross per 24 hour  Intake --  Output 950 ml  Net -950 ml   Filed Weights   07/16/2020 1056  Weight: 72.6 kg    Examination:   Constitutional: unresponsive CV: No cyanosis.   RESP: normal respiratory effort, on RA Extremities: No effusions, edema in BLE SKIN: warm, dry  Foley with clear urine   Data Reviewed: I have personally reviewed following labs and imaging studies  CBC: Recent Labs  Lab 07/30/2020 1058 07/23/20 0407 07/24/20 0500  WBC 18.7* 15.9* 18.1*  NEUTROABS 16.2*  --   --   HGB 13.4 10.3* 10.9*  HCT 40.8 31.0* 32.4*  MCV 81.9 81.2 82.2  PLT 824* 583* [id number]   Basic Metabolic Panel: Recent Labs  Lab 07/31/2020 1058 07/27/2020 1417 07/09/2020 1615  07/06/2020 2147 07/23/20 0229 07/23/20 0407 07/23/20 1215 07/24/20 0500  NA 146*  --  148*  --   --  146*  --  151*  K 7.1*   < > 5.5* 5.2* 4.3 4.3 4.2 3.8  CL 109  --  109  --   --  102  --  105  CO2 18*  --  16*  --   --  27  --  25  GLUCOSE 324*  --  211*  --   --  123*  --  134*  BUN 167*  --  166*  --   --  108*  --  90*  CREATININE 6.83*  --  6.46*  --   --  4.33*  --  3.10*  CALCIUM 10.3  --  10.1  --   --  9.0  --  9.2  MG 3.3*  --   --   --   --   --   --  2.1   < > = values in this interval not displayed.   GFR: Estimated Creatinine Clearance: 16.5 mL/min (A) (by C-G formula based on SCr of 3.1 mg/dL (H)). Liver Function Tests: Recent Labs  Lab 07/07/2020 1058  AST 40  ALT 38  ALKPHOS 97  BILITOT 1.3*  PROT 9.0*  ALBUMIN 3.6   No results for input(s): LIPASE, AMYLASE in the last 168 hours. No results for input(s): AMMONIA in the last 168 hours. Coagulation Profile: Recent Labs  Lab 07/23/20 0407  INR 1.1   Cardiac Enzymes: No results for input(s): CKTOTAL, CKMB, CKMBINDEX, TROPONINI in the last 168 hours. BNP (last 3 results) No results for input(s): PROBNP in the last 8760 hours. HbA1C: Recent Labs    07/10/2020 1526  HGBA1C 8.4*   CBG: Recent Labs  Lab 07/24/20 0504 07/24/20 0547 07/24/20 0550 07/24/20 0730 07/24/20 1104  GLUCAP 62* 218* 189* 177* 130*   Lipid Profile: No results for input(s): CHOL, HDL, LDLCALC, TRIG, CHOLHDL, LDLDIRECT in the last 72 hours. Thyroid Function Tests: No results for input(s): TSH, T4TOTAL, FREET4, T3FREE, THYROIDAB in the last 72 hours. Anemia Panel: No results for input(s): VITAMINB12, FOLATE, FERRITIN, TIBC, IRON, RETICCTPCT in the last 72 hours. Sepsis Labs: Recent Labs  Lab 08/01/2020 1058 07/13/2020 1748 07/23/20 0407  PROCALCITON  --   --  1.95  LATICACIDVEN 2.1* 1.9  --     Recent Results (from the past 240 hour(s))  SARS Coronavirus 2 by RT PCR (hospital order, performed in [facility] hospital lab)  Nasopharyngeal Nasopharyngeal Swab     Status: Abnormal   Collection Time: 07/10/2020  1:15 PM   Specimen: Nasopharyngeal Swab  Result Value Ref Range Status   SARS Coronavirus 2 POSITIVE (A) NEGATIVE Final    Comment: RESULT CALLED TO, READ BACK BY AND VERIFIED WITH: ROBIN REGISTER AT 1453 ON 07/07/2020 BY SS (NOTE) SARS-CoV-2 target nucleic acids are DETECTED  SARS-CoV-2 RNA is generally detectable in upper respiratory specimens  during the acute phase of infection.  Positive results are  indicative  of the presence of the identified virus, but do not rule out bacterial infection or co-infection with other pathogens not detected by the test.  Clinical correlation with patient history and  other diagnostic information is necessary to determine patient infection status.  The expected result is negative.  Fact Sheet for Patients:   StrictlyIdeas.no   Fact Sheet for Healthcare Providers:   BankingDealers.co.za    This test is not yet approved or cleared by the [place] FDA and  has been authorized for detection and/or diagnosis of SARS-CoV-2 by FDA under an Emergency Use Authorization (EUA).  This EUA will remain in effect (meaning th is test can be used) for the duration of  the COVID-19 declaration under Section 564(b)(1) of the Act, 21 U.S.C. section 360-bbb-3(b)(1), unless the authorization is terminated or revoked sooner.  Performed at [facility], [address] [place], [place] [address]   Culture, blood (single)     Status: None (Preliminary result)   Collection Time: 07/24/2020  2:17 PM   Specimen: BLOOD  Result Value Ref Range Status   Specimen Description BLOOD RIGHT ANTECUBITAL  Final   Special Requests   Final    BOTTLES DRAWN AEROBIC AND ANAEROBIC Blood Culture adequate volume   Culture   Final    NO GROWTH 2 DAYS Performed at [facility], [address], [place], [place] [address]    Report  Status PENDING  Incomplete  Urine Culture     Status: Abnormal (Preliminary result)   Collection Time: 07/30/2020  2:17 PM   Specimen: Urine, Random  Result Value Ref Range Status   Specimen Description   Final    URINE, RANDOM Performed at [facility], [address] [place], [place] [address]    Special Requests   Final    NONE Performed at [facility], [address] [place], [place] [address]    Culture (A)  Final    >=100,000 COLONIES/mL ESCHERICHIA COLI SUSCEPTIBILITIES TO FOLLOW Performed at [facility], [place] [address], [place], [place] [address]    Report Status PENDING  Incomplete      Radiology Studies: CT HEAD WO CONTRAST  Result Date: 07/23/2020 CLINICAL DATA:  Initial evaluation for acute neuro deficit, stroke suspected. EXAM: CT HEAD WITHOUT CONTRAST TECHNIQUE: Contiguous axial images were obtained from the base of the skull through the vertex without intravenous contrast. COMPARISON:  Previous MRI from 09/11/2018. FINDINGS: Brain: Moderately advanced age-related cerebral atrophy with chronic small vessel ischemic disease. No acute intracranial hemorrhage. No acute large vessel territory infarct. No mass lesion, midline shift or mass effect. No hydrocephalus or extra-axial fluid collection. Vascular: No hyperdense vessel. Calcified atherosclerosis present at skull base. Skull: Scalp soft tissues within normal limits.  Calvarium intact. Sinuses/Orbits: Globes and orbital soft tissues within normal limits. Paranasal sinuses are clear. No significant mastoid effusion. Other: None. IMPRESSION: 1. No acute intracranial abnormality. 2. Moderately advanced age-related cerebral atrophy with chronic small vessel ischemic disease. Electronically Signed   By: Jeannine Boga M.D.   On: 07/23/2020 00:43   CT RENAL STONE STUDY  Addendum Date: 07/08/2020   ADDENDUM REPORT: 07/10/2020 18:31 ADDENDUM: Right lower extremity central venous catheter with tip  terminating in the infra hepatic IVC just above the renal veins. Electronically Signed   By: Donavan Foil M.D.   On: 07/27/2020 18:31   Result Date: 07/13/2020 CLINICAL DATA:  Short of breath week EXAM: CT ABDOMEN AND PELVIS WITHOUT CONTRAST TECHNIQUE: Multidetector  CT imaging of the abdomen and pelvis was performed following the standard protocol without IV contrast. COMPARISON:  Ultrasound 05/14/2009 FINDINGS: Lower chest: lung bases demonstrate mild lower lobe bronchial wall thickening with minimal dependent atelectasis. No consolidation or pleural effusion. Coronary vascular calcifications. Mild gynecomastia Hepatobiliary: No focal liver abnormality is seen. No gallstones, gallbladder wall thickening, or biliary dilatation. Pancreas: Unremarkable. No pancreatic ductal dilatation or surrounding inflammatory changes. Spleen: Normal in size without focal abnormality. Adrenals/Urinary Tract: Adrenal glands are normal. Kidneys show no hydronephrosis. Thickened urinary bladder wall right-side predominant. Foley catheter and small amount of air within the bladder. Stomach/Bowel: Stomach is within normal limits. Appendix appears normal. No evidence of bowel wall thickening, distention, or inflammatory changes. Vascular/Lymphatic: Moderate aortic atherosclerosis. No aneurysm. No suspicious nodes. Reproductive: Markedly enlarged prostate with asymmetric bulging on the right side. Other: Negative for free air or free fluid. Small fat containing umbilical hernia Musculoskeletal: Degenerative changes. No acute or suspicious osseous abnormality IMPRESSION: 1. Negative for hydronephrosis or ureteral stone. 2. Markedly enlarged prostate gland with asymmetric bulging/questionable mass on the right side. 3. Thickened urinary bladder wall, slightly asymmetric on the right side. This could be due to cystitis or chronic obstruction with neoplastic etiology not entirely excluded. Aortic Atherosclerosis (ICD10-I70.0).  Electronically Signed: By: Donavan Foil M.D. On: 07/16/2020 18:17     Scheduled Meds: . Chlorhexidine Gluconate Cloth  6 each Topical Q0600  . heparin  5,000 Units Subcutaneous Q8H  . insulin aspart  0-9 Units Subcutaneous Q4H   Continuous Infusions: . sodium chloride 75 mL/hr at 07/24/20 1010  . cefTRIAXone (ROCEPHIN)  IV Stopped (07/23/20 2303)     LOS: 2 days     Shawn Bi, MD [facility] If 7PM-7AM, please contact night-coverage 07/24/2020, 3:52 PM

## 2020-07-24 NOTE — ED Notes (Signed)
Pt disoriented x4, not answering questions, alert to voice. RR even and unlabored. Stretcher locked In lowest position

## 2020-07-24 NOTE — Progress Notes (Incomplete)
Patient resting. Temp 97.4 Repositioned patient. Peri-care provided.

## 2020-07-25 DIAGNOSIS — A419 Sepsis, unspecified organism: Secondary | ICD-10-CM | POA: Diagnosis not present

## 2020-07-25 DIAGNOSIS — F0391 Unspecified dementia with behavioral disturbance: Secondary | ICD-10-CM | POA: Diagnosis not present

## 2020-07-25 DIAGNOSIS — N390 Urinary tract infection, site not specified: Secondary | ICD-10-CM | POA: Diagnosis not present

## 2020-07-25 DIAGNOSIS — N179 Acute kidney failure, unspecified: Secondary | ICD-10-CM | POA: Diagnosis not present

## 2020-07-25 LAB — BASIC METABOLIC PANEL
Anion gap: 20 — ABNORMAL HIGH (ref 5–15)
BUN: 75 mg/dL — ABNORMAL HIGH (ref 8–23)
CO2: 25 mmol/L (ref 22–32)
Calcium: 9.4 mg/dL (ref 8.9–10.3)
Chloride: 107 mmol/L (ref 98–111)
Creatinine, Ser: 2.81 mg/dL — ABNORMAL HIGH (ref 0.61–1.24)
GFR, Estimated: 21 mL/min — ABNORMAL LOW (ref >60)
Glucose, Bld: 207 mg/dL — ABNORMAL HIGH (ref 70–99)
Potassium: 3.9 mmol/L (ref 3.5–5.1)
Sodium: 152 mmol/L — ABNORMAL HIGH (ref 135–145)

## 2020-07-25 LAB — URINE CULTURE: Culture: >=100000 — AB

## 2020-07-25 LAB — CBG MONITORING, ED: Glucose-Capillary: 182 mg/dL — ABNORMAL HIGH (ref 70–99)

## 2020-07-25 LAB — CBC
HCT: 37 % — ABNORMAL LOW (ref 39.0–52.0)
Hemoglobin: 12.2 g/dL — ABNORMAL LOW (ref 13.0–17.0)
MCH: 27.7 pg (ref 26.0–34.0)
MCHC: 33 g/dL (ref 30.0–36.0)
MCV: 84.1 fL (ref 80.0–100.0)
Platelets: 519 10*3/uL — ABNORMAL HIGH (ref 150–400)
RBC: 4.4 MIL/uL (ref 4.22–5.81)
RDW: 13.8 % (ref 11.5–15.5)
WBC: 16.6 10*3/uL — ABNORMAL HIGH (ref 4.0–10.5)
nRBC: 0 % (ref 0.0–0.2)

## 2020-07-25 LAB — GLUCOSE, CAPILLARY
Glucose-Capillary: 200 mg/dL — ABNORMAL HIGH (ref 70–99)
Glucose-Capillary: 165 mg/dL — ABNORMAL HIGH (ref 70–99)
Glucose-Capillary: 268 mg/dL — ABNORMAL HIGH (ref 70–99)
Glucose-Capillary: 268 mg/dL — ABNORMAL HIGH (ref 70–99)
Glucose-Capillary: 255 mg/dL — ABNORMAL HIGH (ref 70–99)

## 2020-07-25 LAB — MAGNESIUM: Magnesium: 2 mg/dL (ref 1.7–2.4)

## 2020-07-25 LAB — C-REACTIVE PROTEIN: CRP: 21.2 mg/dL — ABNORMAL HIGH (ref <1.0)

## 2020-07-25 MED ORDER — METHYLPREDNISOLONE SODIUM SUCC 40 MG IJ SOLR
40.0000 mg | Freq: Three times a day (TID) | INTRAMUSCULAR | Status: DC
  Administered 2020-07-25 – 2020-07-28 (×9): 40 mg via INTRAVENOUS
  Filled 2020-07-25 (×9): qty 1

## 2020-07-25 MED ORDER — INSULIN ASPART 100 UNIT/ML ~~LOC~~ SOLN
0.0000 [IU] | Freq: Three times a day (TID) | SUBCUTANEOUS | Status: DC
  Administered 2020-07-26: 13:00:00 7 [IU] via SUBCUTANEOUS
  Administered 2020-07-26: 17:00:00 3 [IU] via SUBCUTANEOUS
  Administered 2020-07-26 – 2020-07-27 (×2): 15 [IU] via SUBCUTANEOUS
  Administered 2020-07-27: 10:00:00 11 [IU] via SUBCUTANEOUS
  Administered 2020-07-27: 4 [IU] via SUBCUTANEOUS
  Administered 2020-07-28: 20 [IU] via SUBCUTANEOUS
  Administered 2020-07-28: 10:00:00 15 [IU] via SUBCUTANEOUS
  Administered 2020-07-28: 18:00:00 3 [IU] via SUBCUTANEOUS
  Administered 2020-07-29: 11:00:00 15 [IU] via SUBCUTANEOUS
  Administered 2020-07-29: 16:00:00 4 [IU] via SUBCUTANEOUS
  Administered 2020-07-30: 3 [IU] via SUBCUTANEOUS
  Administered 2020-07-30: 7 [IU] via SUBCUTANEOUS
  Administered 2020-07-30: 17:00:00 3 [IU] via SUBCUTANEOUS
  Administered 2020-07-31 (×2): 7 [IU] via SUBCUTANEOUS
  Administered 2020-08-01: 11 [IU] via SUBCUTANEOUS
  Administered 2020-08-01: 3 [IU] via SUBCUTANEOUS
  Administered 2020-08-02: 15 [IU] via SUBCUTANEOUS
  Filled 2020-07-25 (×18): qty 1

## 2020-07-25 NOTE — Progress Notes (Signed)
[facility]  ROUNDING NOTE   Subjective:   D5W at 83m/hr  Continues to have poor oral intake.   Creatinine 2.81 (3.1) Na 152 (151)  Objective:  Vital signs in last 24 hours:  Temp:  [96.6 F (35.9 C)-98.7 F (37.1 C)] 96.6 F (35.9 C) (01/23 1238) Pulse Rate:  [73-113] 73 (01/23 1238) Resp:  [18-20] 20 (01/23 1238) BP: (108-151)/(74-100) 108/80 (01/23 1238) SpO2:  [92 %-100 %] 92 % (01/23 1238) Weight:  [55.3 kg] 55.3 kg (01/23 0105)  Weight change:  Filed Weights   07/06/2020 1056 07/25/20 0105  Weight: 72.6 kg 55.3 kg    Intake/Output: I/O last 3 completed shifts: In: -  Out: 800 [Urine:800]   Intake/Output this shift:  Total I/O In: 1674.6 [P.O.:237; I.V.:1137.6; IV Piggyback:300] Out: -   Physical Exam: General: NAD, laying in bed  Head: Normocephalic, atraumatic. Dry oral mucosal membranes  Eyes: Anicteric, PERRL  Neck: Supple, trachea midline  Lungs:  Clear to auscultation  Heart: Regular rate and rhythm  Abdomen:  Soft, nontender,   Extremities:  no peripheral edema.  Neurologic: confused  Skin: No lesions   Access Right femoral temp HD catheter 1/20 Dr. DLucky Cowboy   Basic Metabolic Panel: Recent Labs  Lab 07/20/2020 1058 07/03/2020 1417 07/12/2020 1615 07/19/2020 2147 07/23/20 0229 07/23/20 0407 07/23/20 1215 07/24/20 0500 07/25/20 0527  NA 146*  --  148*  --   --  146*  --  151* 152*  K 7.1*   < > 5.5*   < > 4.3 4.3 4.2 3.8 3.9  CL 109  --  109  --   --  102  --  105 107  CO2 18*  --  16*  --   --  27  --  25 25  GLUCOSE 324*  --  211*  --   --  123*  --  134* 207*  BUN 167*  --  166*  --   --  108*  --  90* 75*  CREATININE 6.83*  --  6.46*  --   --  4.33*  --  3.10* 2.81*  CALCIUM 10.3  --  10.1  --   --  9.0  --  9.2 9.4  MG 3.3*  --   --   --   --   --   --  2.1 2.0   < > = values in this interval not displayed.    Liver Function Tests: Recent Labs  Lab 07/29/2020 1058  AST 40  ALT 38  ALKPHOS 97  BILITOT 1.3*  PROT 9.0*   ALBUMIN 3.6   No results for input(s): LIPASE, AMYLASE in the last 168 hours. No results for input(s): AMMONIA in the last 168 hours.  CBC: Recent Labs  Lab 07/08/2020 1058 07/23/20 0407 07/24/20 0500 07/25/20 0527  WBC 18.7* 15.9* 18.1* 16.6*  NEUTROABS 16.2*  --   --   --   HGB 13.4 10.3* 10.9* 12.2*  HCT 40.8 31.0* 32.4* 37.0*  MCV 81.9 81.2 82.2 84.1  PLT 824* 583* 539* 519*    Cardiac Enzymes: No results for input(s): CKTOTAL, CKMB, CKMBINDEX, TROPONINI in the last 168 hours.  BNP: Invalid input(s): POCBNP  CBG: Recent Labs  Lab 07/24/20 1638 07/24/20 2239 07/25/20 0309 07/25/20 0824 07/25/20 1235  GLUCAP 164* 182* 200* 165* 227    Microbiology: Results for orders placed or performed during the hospital encounter of 07/21/2020  SARS Coronavirus 2 by RT PCR (hospital order, performed  in [place] lab) Nasopharyngeal Nasopharyngeal Swab     Status: Abnormal   Collection Time: 07/13/2020  1:15 PM   Specimen: Nasopharyngeal Swab  Result Value Ref Range Status   SARS Coronavirus 2 POSITIVE (A) NEGATIVE Final    Comment: RESULT CALLED TO, READ BACK BY AND VERIFIED WITH: ROBIN REGISTER AT 1453 ON 07/04/2020 BY SS (NOTE) SARS-CoV-2 target nucleic acids are DETECTED  SARS-CoV-2 RNA is generally detectable in upper respiratory specimens  during the acute phase of infection.  Positive results are indicative  of the presence of the identified virus, but do not rule out bacterial infection or co-infection with other pathogens not detected by the test.  Clinical correlation with patient history and  other diagnostic information is necessary to determine patient infection status.  The expected result is negative.  Fact Sheet for Patients:   StrictlyIdeas.no   Fact Sheet for Healthcare Providers:   BankingDealers.co.za    This test is not yet approved or cleared by the [place] FDA and  has been authorized  for detection and/or diagnosis of SARS-CoV-2 by FDA under an Emergency Use Authorization (EUA).  This EUA will remain in effect (meaning th is test can be used) for the duration of  the COVID-19 declaration under Section 564(b)(1) of the Act, 21 U.S.C. section 360-bbb-3(b)(1), unless the authorization is terminated or revoked sooner.  Performed at [facility], [address] [place], [place] [address]   Culture, blood (single)     Status: None (Preliminary result)   Collection Time: 08/01/2020  2:17 PM   Specimen: BLOOD  Result Value Ref Range Status   Specimen Description BLOOD RIGHT ANTECUBITAL  Final   Special Requests   Final    BOTTLES DRAWN AEROBIC AND ANAEROBIC Blood Culture adequate volume   Culture   Final    NO GROWTH 3 DAYS Performed at [facility], [address] [place], [place] [address]    Report Status PENDING  Incomplete  Urine Culture     Status: Abnormal   Collection Time: 07/29/2020  2:17 PM   Specimen: Urine, Random  Result Value Ref Range Status   Specimen Description   Final    URINE, RANDOM Performed at [facility], [address] [place], [place] [address]    Special Requests   Final    NONE Performed at [facility], [address] [place],  [address]    Culture >=100,000 COLONIES/mL ESCHERICHIA COLI (A)  Final   Report Status 07/25/2020 FINAL  Final   Organism ID, Bacteria ESCHERICHIA COLI (A)  Final      Susceptibility   Escherichia coli - MIC*    AMPICILLIN <=2 SENSITIVE Sensitive     CEFAZOLIN <=4 SENSITIVE Sensitive     CEFEPIME <=0.12 SENSITIVE Sensitive     CEFTRIAXONE <=0.25 SENSITIVE Sensitive     CIPROFLOXACIN <=0.25 SENSITIVE Sensitive     GENTAMICIN <=1 SENSITIVE Sensitive     IMIPENEM <=0.25 SENSITIVE Sensitive     NITROFURANTOIN <=16 SENSITIVE Sensitive     TRIMETH/SULFA <=20 SENSITIVE Sensitive     AMPICILLIN/SULBACTAM <=2 SENSITIVE Sensitive     PIP/TAZO <=4 SENSITIVE Sensitive      * >=100,000 COLONIES/mL ESCHERICHIA COLI    Coagulation Studies: Recent Labs    07/23/20 0407  LABPROT 14.2  INR 1.1    Urinalysis: No results for input(s): COLORURINE, LABSPEC, PHURINE, GLUCOSEU, HGBUR, BILIRUBINUR, KETONESUR, PROTEINUR, UROBILINOGEN, NITRITE, LEUKOCYTESUR in the last 72 hours.  Invalid input(s): APPERANCEUR  Imaging: No results found.   Medications:   . cefTRIAXone (ROCEPHIN)  IV Stopped (07/25/20 0109)  . dextrose 50 mL/hr at 07/25/20 0114   . Chlorhexidine Gluconate Cloth  6 each Topical Q0600  . heparin  5,000 Units Subcutaneous Q8H  . insulin aspart  0-9 Units Subcutaneous Q4H  . methylPREDNISolone (SOLU-MEDROL) injection  40 mg Intravenous Q8H   acetaminophen **OR** acetaminophen, haloperidol lactate, hydrALAZINE, ondansetron **OR** ondansetron (ZOFRAN) IV  Assessment/ Plan:  Mr. Shawn May is a 87 y.o. black male with dementia, diabetes mellitus type 2, hyperlipidemia, hypertension, who was admitted to [facility] on 07/06/2020 for evaluation of shortness of breath and weakness.  1.  Acute kidney injury on chronic kidney disease stage IV with proteinuria: baseline creatinine of 2.19, GFR of 26 on 12/25/2019.  Followed by [facility] nephrology as an outpatient.   Suspect acute kidney injury now due to severe volume depletion and dehydration.   Emergent hemodialysis on admission.  Chronic kidney disease secondary to diabetic nephropathy - no indication for further dialysis. Nonoliguric urine output.  - Plan on removal of dialysis catheter tomorrow if renal function continues to improve  2.  Hypernatremia: secondary to free water deficit 2.4 liters - Continue dextrose infusion    LOS: 3 Rupa Lagan 1/23/20224:38 PM

## 2020-07-25 NOTE — Progress Notes (Signed)
PROGRESS NOTE    Shawn May  [id number] DOB: 05-Dec-1931 DOA: 07/03/2020 PCP: [facility]    Assessment & Plan:   Principal Problem:   Sepsis ([place]) Active Problems:   AKI (acute kidney injury) ([place])   Dementia ([place])   Type 2 diabetes mellitus with stage 3 chronic kidney disease ([place])   Acute lower UTI   Shawn May is a 87 y.o. 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  Per patient's wife he has had poor oral intake for the last couple of days and has been increasingly weak.  Patient states that he has not had any falls at home.  Per EMS his room air pulse oximetry at rest was in the 80s and improved following oxygen supplementation to the low 90s.   Severe Sepsis from E coli UTI with complications of acute kidney injury POA  As evidenced by tachycardia, marked leukocytosis with a left shift, pyuria and elevated lactic acid level 2.1 Patient also noted to have worsening renal failure --Started patient on Rocephin 1 g IV daily --urine cx added on, grew pan-sensitive E coli Plan: --cont ceftriaxone  Acute on chronic kidney injury CKD3b --creatinine of 6.83 on admission compared to baseline of 2.46  --Patient followed by [facility] nephrology as an outpatient.  Suspect acute kidney injury due to severe volume depletion and dehydration.  CT for renal stone protocol was negative for hydronephrosis.   --Patient underwent urgent dialysis for significant azotemia and hyperkalemia yesterday.   Plan: --dialysis per nephrology --Hold quinapril and hydrochlorothiazide --cont D5'@50'$ , per nephrology  Hyperkalemia, POA --resolved with dialysis --dialysis per nephrology  COVID infection --no respiratory symptoms, no  hypoxia, CXR clear. --CRP 24.4 on presentation Plan: --start solumedrol 40 mg q8h --Ensure CRP down-trending  Type 2 diabetes mellitus with stage III chronic kidney disease Uncontrolled --A1c 8.4 --hold home glipizide and Tradjenta --SSI TID resistant scale  Acute metabolic encephalopathy underlying dementia --sepsis, uremia, covid encephalopathy... Plan: --Hold Namenda and Aricept --order tele sitter  Dysphagia --SLP eval today, recommended Dys1 with Nectar thick fluid.  Hypernatremia --Na 146 on presentation, worsened to 151 despite being on hypotonic infusion Plan: --cont D5'@50'$ , per nephrology   DVT prophylaxis: Heparin SQ Code Status: DNR  Family Communication:  Status is: inpatient Dispo:   The patient is from: home Anticipated d/c is to: undetermined Anticipated d/c date is: > 3 days Patient currently is not medically ready to d/c due to: unresponsive, AKI starting dialysis, on IV abx   Subjective and Interval History:  Pt not answering questions, mostly sleeping.  SLP recommended Dys1 with Nectar thick fluid.   Objective: Vitals:   07/25/20 0615 07/25/20 0821 07/25/20 1238 07/25/20 2015  BP: 127/74 (!) 141/96 108/80 (!) 125/93  Pulse: (!) 113 79 73 (!) 105  Resp: '19 18 20 17  '$ Temp: 97.7 F (36.5 C) 97.8 F (36.6 C) (!) 96.6 F (35.9 C) (!) 97.5 F (36.4 C)  TempSrc:    Oral  SpO2: 96% 100% 92% 100%  Weight:      Height:        Intake/Output Summary (Last 24 hours) at 07/25/2020 2118 Last data filed at 07/25/2020 1523 Gross per 24 hour  Intake 1674.62 ml  Output --  Net 1674.62 ml   [employer]  07/31/2020 1056 07/25/20 0105  Weight: 72.6 kg 55.3 kg    Examination:   Constitutional: NAD, sleeping, not responding to questions  CV: No cyanosis.   RESP: normal respiratory effort, on RA Extremities: No effusions, edema in BLE SKIN: warm, dry Foley present   Data Reviewed: I have personally reviewed following labs and imaging  studies  CBC: Recent Labs  Lab 07/05/2020 1058 07/23/20 0407 07/24/20 0500 07/25/20 0527  WBC 18.7* 15.9* 18.1* 16.6*  NEUTROABS 16.2*  --   --   --   HGB 13.4 10.3* 10.9* 12.2*  HCT 40.8 31.0* 32.4* 37.0*  MCV 81.9 81.2 82.2 84.1  PLT 824* 583* 539* [id number]*   Basic Metabolic Panel: Recent Labs  Lab 07/30/2020 1058 07/06/2020 1417 07/21/2020 1615 07/30/2020 2147 07/23/20 0229 07/23/20 0407 07/23/20 1215 07/24/20 0500 07/25/20 0527  NA 146*  --  148*  --   --  146*  --  151* 152*  K 7.1*   < > 5.5*   < > 4.3 4.3 4.2 3.8 3.9  CL 109  --  109  --   --  102  --  105 107  CO2 18*  --  16*  --   --  27  --  25 25  GLUCOSE 324*  --  211*  --   --  123*  --  134* 207*  BUN 167*  --  166*  --   --  108*  --  90* 75*  CREATININE 6.83*  --  6.46*  --   --  4.33*  --  3.10* 2.81*  CALCIUM 10.3  --  10.1  --   --  9.0  --  9.2 9.4  MG 3.3*  --   --   --   --   --   --  2.1 2.0   < > = values in this interval not displayed.   GFR: Estimated Creatinine Clearance: 14.8 mL/min (A) (by C-G formula based on SCr of 2.81 mg/dL (H)). Liver Function Tests: Recent Labs  Lab 07/10/2020 1058  AST 40  ALT 38  ALKPHOS 97  BILITOT 1.3*  PROT 9.0*  ALBUMIN 3.6   No results for input(s): LIPASE, AMYLASE in the last 168 hours. No results for input(s): AMMONIA in the last 168 hours. Coagulation Profile: Recent Labs  Lab 07/23/20 0407  INR 1.1   Cardiac Enzymes: No results for input(s): CKTOTAL, CKMB, CKMBINDEX, TROPONINI in the last 168 hours. BNP (last 3 results) No results for input(s): PROBNP in the last 8760 hours. HbA1C: No results for input(s): HGBA1C in the last 72 hours. CBG: Recent Labs  Lab 07/25/20 0309 07/25/20 0824 07/25/20 1235 07/25/20 1747 07/25/20 2015  GLUCAP 200* 165* 268* 268* 255*   Lipid Profile: No results for input(s): CHOL, HDL, LDLCALC, TRIG, CHOLHDL, LDLDIRECT in the last 72 hours. Thyroid Function Tests: No results for input(s): TSH, T4TOTAL, FREET4, T3FREE,  THYROIDAB in the last 72 hours. Anemia Panel: No results for input(s): VITAMINB12, FOLATE, FERRITIN, TIBC, IRON, RETICCTPCT in the last 72 hours. Sepsis Labs: Recent Labs  Lab 07/25/2020 1058 07/21/2020 1748 07/23/20 0407  PROCALCITON  --   --  1.95  LATICACIDVEN 2.1* 1.9  --     Recent Results (from the past 240 hour(s))  SARS Coronavirus 2 by RT PCR (hospital order, performed in [facility] hospital lab) Nasopharyngeal Nasopharyngeal Swab     Status: Abnormal   Collection Time: 07/24/2020  1:15 PM   Specimen: Nasopharyngeal Swab  Result Value Ref Range Status   SARS Coronavirus 2 POSITIVE (A) NEGATIVE Final    Comment: RESULT CALLED TO, READ BACK BY AND VERIFIED WITH: ROBIN REGISTER AT 1453 ON 07/11/2020 BY SS (NOTE) SARS-CoV-2 target nucleic acids are DETECTED  SARS-CoV-2 RNA is generally detectable in upper respiratory specimens  during the acute phase of infection.  Positive results are indicative  of the presence of the identified virus, but do not rule out bacterial infection or co-infection with other pathogens not detected by the test.  Clinical correlation with patient history and  other diagnostic information is necessary to determine patient infection status.  The expected result is negative.  Fact Sheet for Patients:   StrictlyIdeas.no   Fact Sheet for Healthcare Providers:   BankingDealers.co.za    This test is not yet approved or cleared by the [place] FDA and  has been authorized for detection and/or diagnosis of SARS-CoV-2 by FDA under an Emergency Use Authorization (EUA).  This EUA will remain in effect (meaning th is test can be used) for the duration of  the COVID-19 declaration under Section 564(b)(1) of the Act, 21 U.S.C. section 360-bbb-3(b)(1), unless the authorization is terminated or revoked sooner.  Performed at [facility], [address] [place], [place] [address]   Culture, blood  (single)     Status: None (Preliminary result)   Collection Time: 07/16/2020  2:17 PM   Specimen: BLOOD  Result Value Ref Range Status   Specimen Description BLOOD RIGHT ANTECUBITAL  Final   Special Requests   Final    BOTTLES DRAWN AEROBIC AND ANAEROBIC Blood Culture adequate volume   Culture   Final    NO GROWTH 3 DAYS Performed at [facility], [address], [place], [place] [address]    Report Status PENDING  Incomplete  Urine Culture     Status: Abnormal   Collection Time: 07/16/2020  2:17 PM   Specimen: Urine, Random  Result Value Ref Range Status   Specimen Description   Final    URINE, RANDOM Performed at [facility], [address] [place], [place] [address]    Special Requests   Final    NONE Performed at [facility], [address] [place], [place] [address]    Culture >=100,000 COLONIES/mL ESCHERICHIA COLI (A)  Final   Report Status 07/25/2020 FINAL  Final   Organism ID, Bacteria ESCHERICHIA COLI (A)  Final      Susceptibility   Escherichia coli - MIC*    AMPICILLIN <=2 SENSITIVE Sensitive     CEFAZOLIN <=4 SENSITIVE Sensitive     CEFEPIME <=0.12 SENSITIVE Sensitive     CEFTRIAXONE <=0.25 SENSITIVE Sensitive     CIPROFLOXACIN <=0.25 SENSITIVE Sensitive     GENTAMICIN <=1 SENSITIVE Sensitive     IMIPENEM <=0.25 SENSITIVE Sensitive     NITROFURANTOIN <=16 SENSITIVE Sensitive     TRIMETH/SULFA <=20 SENSITIVE Sensitive     AMPICILLIN/SULBACTAM <=2 SENSITIVE Sensitive     PIP/TAZO <=4 SENSITIVE Sensitive     * >=100,000 COLONIES/mL ESCHERICHIA COLI      Radiology Studies: No results found.   Scheduled Meds: . Chlorhexidine Gluconate Cloth  6 each Topical Q0600  . heparin  5,000 Units Subcutaneous Q8H  . insulin aspart  0-9 Units Subcutaneous Q4H  . methylPREDNISolone (SOLU-MEDROL) injection  40 mg Intravenous Q8H   Continuous Infusions: . cefTRIAXone (ROCEPHIN)  IV 1 g (07/25/20 2102)  . dextrose 50 mL/hr at 07/25/20 0114  LOS: 3 days     Enzo Bi, MD [facility] If 7PM-7AM, please contact night-coverage 07/25/2020, 9:18 PM

## 2020-07-25 NOTE — Evaluation (Signed)
Clinical/Bedside Swallow Evaluation Patient Details  Name: Shawn May MRN: [id number] Date of Birth: 12-08-1931  Today's Date: 07/25/2020 Time: SLP Start Time (ACUTE ONLY): 1215 SLP Stop Time (ACUTE ONLY): 1315 SLP Time Calculation (min) (ACUTE ONLY): 60 min  Past Medical History:  Past Medical History:  Diagnosis Date  . Dementia ([place])   . Diabetes mellitus without complication ([place])   . Hyperlipidemia   . Hypertension   . Renal disorder    Past Surgical History: History reviewed. No pertinent surgical history. HPI:  Pt is a 87 y.o. male with medical history significant for dementia??  Alzheimer's versus vascular, history of CVA, diabetes mellitus with complications of stage III chronic kidney disease, hypertension and dyslipidemia who was brought into the ER by EMS for evaluation of shortness of breath and weakness.  Per patient's wife he has had poor oral intake for the last couple of days and has been increasingly weak.  Patient states that he has not had any falls at home.  Per EMS his room air pulse oximetry at rest was in the 80s and improved following oxygen supplementation to the low 90s.  Patient is agitated and unable to provide any history at this time. Nephrology was consulted by emergency room physician and a temporary dialysis catheter was inserted for hemodialysis.  Patient appears to be septic most likely from urinary source and received broad-spectrum antibiotic therapy with vancomycin and cefepime.  He will be admitted to the hospital for further evaluation.   Assessment / Plan / Recommendation Clinical Impression  Pt appears to present w/ oropharyngeal phase dysphagia in light of Significantly declined Cognitive status; Dementia. This impacts his overall awareness/engagement and safety during po tasks which increases risk for aspiration, choking. Pt's risk for aspiration, as well as concern of meeting nutritional needs fully, is present but is reduced when following  general aspiration precautions, given feeding assistance, and using a Modified diet. He requires Max tactile/verbal/ visual cues for orientation to bolus presentation, and during feeding support(he often maintained closed eyes and required tactile cues of spoon at lips to open mouth during the eval). Pt consumed trials of Nectar liquids VIA TSP and purees w/ No immediate, overt clinical s/s of aspiration noted; no decline in respiratory status during/post trials. Oral phase was c/b poor oral prep awareness to accept and manage boluses; min slow A-P transfer and swallow; oral clearing achieved w/ boluses given Time w/ each trial. He often presented w/ a closed mouth but was reponsive to tactile stim of spoon at lips. OM Exam was unable to be completed; No unilateral weakness noted. Pt appeared confused w/ attempted oral care and bit on the swab stick and turned head away. D/t pt's declined Cognitive status and his risk for aspiration, recommend modifying the diet to a dysphagia level 1(puree) w/ Nectar liquids to lessen risk for aspiration; aspiration precautions; reduce Distractions during meals and only give po's when pt is alert, participative. Pills Crushed in Puree for safer swallowing. Support w/ feeding at meals -- check for oral clearing during/post intake. NSG updated. ST services recommends follow w/ Palliative Care for [place]. ST services be availabel for further f/u if pt becomes ready for any upgrade of diet while admitted. Largely suspect that pt's Dementia could hamper oral intake and upgrade of diet. Precautions posted in room. MD/NSG updated. SLP Visit Diagnosis: Dysphagia, oropharyngeal phase (R13.12) (baseline Dementia)    Aspiration Risk  Mild aspiration risk;Moderate aspiration risk;Risk for inadequate nutrition/hydration    Diet Recommendation  Dysphagia level 1 (puree) w/ Nectar consistency liquids VIA TSP, small Cup sip.  Aspiration precautions; feeding support at all meals   Medication  Administration: Crushed with puree (for safer swallow)    Other  Recommendations Recommended Consults:  (dietician f/u; Palliative Care f/u) Oral Care Recommendations: Oral care BID;Oral care before and after PO;Staff/trained caregiver to provide oral care Other Recommendations: Order thickener from pharmacy;Prohibited food (jello, ice cream, thin soups);Remove water pitcher;Have oral suction available   Follow up Recommendations  (TBD)      Frequency and Duration min 2x/week  2 weeks       Prognosis Prognosis for Safe Diet Advancement: Guarded Barriers to Reach Goals: Cognitive deficits;Time post onset;Severity of deficits      Swallow Study   General Date of Onset: 08/22/20 HPI: Pt is a 87 y.o. male with medical history significant for dementia??  Alzheimer's versus vascular, history of CVA, diabetes mellitus with complications of stage III chronic kidney disease, hypertension and dyslipidemia who was brought into the ER by EMS for evaluation of shortness of breath and weakness.  Per patient's wife he has had poor oral intake for the last couple of days and has been increasingly weak.  Patient states that he has not had any falls at home.  Per EMS his room air pulse oximetry at rest was in the 80s and improved following oxygen supplementation to the low 90s.  Patient is agitated and unable to provide any history at this time. Nephrology was consulted by emergency room physician and a temporary dialysis catheter was inserted for hemodialysis.  Patient appears to be septic most likely from urinary source and received broad-spectrum antibiotic therapy with vancomycin and cefepime.  He will be admitted to the hospital for further evaluation. Type of Study: Bedside Swallow Evaluation Previous Swallow Assessment: none Diet Prior to this Study: Dysphagia 2 (chopped);Thin liquids Temperature Spikes Noted: No (wbc 16.6 declineing) Respiratory Status: Room air History of Recent Intubation:  No Behavior/Cognition: Alert;Cooperative;Confused;Pleasant mood;Doesn't follow directions (reuired Max cues; nonverbal) Oral Cavity Assessment:  (limited d/t Cognition) Oral Care Completed by SLP: Yes (attmpted) Oral Cavity - Dentition:  (some dentition) Vision:  (n/a) Self-Feeding Abilities: Total assist Patient Positioning: Upright in bed (needed full positioning upright, support) Baseline Vocal Quality:  (nonverbal) Volitional Cough: Cognitively unable to elicit Volitional Swallow: Unable to elicit    Oral/Motor/Sensory Function Overall Oral Motor/Sensory Function:  (CNT d/t cognition; no unilateral weakness noted)   Ice Chips Ice chips: Not tested   Thin Liquid Thin Liquid: Not tested    Nectar Thick Nectar Thick Liquid: Impaired Presentation: Spoon (fed; 15-16 trials) Oral Phase Impairments: Poor awareness of bolus Oral phase functional implications: Prolonged oral transit;Oral holding Pharyngeal Phase Impairments: Suspected delayed Swallow   Honey Thick Honey Thick Liquid: Not tested   Puree Puree: Impaired Presentation: Spoon (fed; 10+ trials) Oral Phase Impairments: Poor awareness of bolus Oral Phase Functional Implications: Prolonged oral transit;Oral holding Pharyngeal Phase Impairments: Suspected delayed Swallow   Solid     Solid: Not tested       Orinda Kenner, MS, CCC-SLP Speech Language Pathologist Rehab Services [phone] Shawn May 07/25/2020,2:40 PM

## 2020-07-26 DIAGNOSIS — N179 Acute kidney failure, unspecified: Secondary | ICD-10-CM | POA: Diagnosis not present

## 2020-07-26 DIAGNOSIS — N170 Acute kidney failure with tubular necrosis: Secondary | ICD-10-CM | POA: Diagnosis not present

## 2020-07-26 DIAGNOSIS — A419 Sepsis, unspecified organism: Secondary | ICD-10-CM | POA: Diagnosis not present

## 2020-07-26 DIAGNOSIS — R6520 Severe sepsis without septic shock: Secondary | ICD-10-CM | POA: Diagnosis not present

## 2020-07-26 LAB — CBC
HCT: 34.4 % — ABNORMAL LOW (ref 39.0–52.0)
Hemoglobin: 11.1 g/dL — ABNORMAL LOW (ref 13.0–17.0)
MCH: 26.6 pg (ref 26.0–34.0)
MCHC: 32.3 g/dL (ref 30.0–36.0)
MCV: 82.5 fL (ref 80.0–100.0)
Platelets: 490 10*3/uL — ABNORMAL HIGH (ref 150–400)
RBC: 4.17 MIL/uL — ABNORMAL LOW (ref 4.22–5.81)
RDW: 13.6 % (ref 11.5–15.5)
WBC: 18.7 10*3/uL — ABNORMAL HIGH (ref 4.0–10.5)
nRBC: 0 % (ref 0.0–0.2)

## 2020-07-26 LAB — BASIC METABOLIC PANEL
Anion gap: 16 — ABNORMAL HIGH (ref 5–15)
BUN: 70 mg/dL — ABNORMAL HIGH (ref 8–23)
CO2: 25 mmol/L (ref 22–32)
Calcium: 8.7 mg/dL — ABNORMAL LOW (ref 8.9–10.3)
Chloride: 106 mmol/L (ref 98–111)
Creatinine, Ser: 2.53 mg/dL — ABNORMAL HIGH (ref 0.61–1.24)
GFR, Estimated: 24 mL/min — ABNORMAL LOW (ref >60)
Glucose, Bld: 345 mg/dL — ABNORMAL HIGH (ref 70–99)
Potassium: 3.3 mmol/L — ABNORMAL LOW (ref 3.5–5.1)
Sodium: 147 mmol/L — ABNORMAL HIGH (ref 135–145)

## 2020-07-26 LAB — MAGNESIUM: Magnesium: 1.9 mg/dL (ref 1.7–2.4)

## 2020-07-26 LAB — GLUCOSE, CAPILLARY
Glucose-Capillary: 327 mg/dL — ABNORMAL HIGH (ref 70–99)
Glucose-Capillary: 206 mg/dL — ABNORMAL HIGH (ref 70–99)
Glucose-Capillary: 140 mg/dL — ABNORMAL HIGH (ref 70–99)

## 2020-07-26 LAB — C-REACTIVE PROTEIN: CRP: 15.2 mg/dL — ABNORMAL HIGH (ref <1.0)

## 2020-07-26 MED ORDER — CEPHALEXIN 250 MG PO CAPS
250.0000 mg | ORAL_CAPSULE | Freq: Two times a day (BID) | ORAL | Status: DC
  Administered 2020-07-26 – 2020-07-28 (×5): 250 mg via ORAL
  Filled 2020-07-26 (×6): qty 1

## 2020-07-26 MED ORDER — LINAGLIPTIN 5 MG PO TABS
5.0000 mg | ORAL_TABLET | Freq: Every day | ORAL | Status: DC
  Administered 2020-07-26 – 2020-08-01 (×7): 5 mg via ORAL
  Filled 2020-07-26 (×8): qty 1

## 2020-07-26 MED ORDER — POTASSIUM CHLORIDE 10 MEQ/100ML IV SOLN
10.0000 meq | INTRAVENOUS | Status: AC
  Administered 2020-07-26 (×2): 10 meq via INTRAVENOUS
  Filled 2020-07-26 (×2): qty 100

## 2020-07-26 NOTE — Progress Notes (Signed)
Inpatient Diabetes Program Recommendations  AACE/ADA: New Consensus Statement on Inpatient Glycemic Control   Target Ranges:  Prepandial:   less than 140 mg/dL      Peak postprandial:   less than 180 mg/dL (1-2 hours)      Critically ill patients:  140 - 180 mg/dL   Results for CHISHOLM, CONARY (MRN [id number]) as of 07/26/2020 12:35  Ref. Range 07/25/2020 08:24 07/25/2020 12:35 07/25/2020 17:47 07/25/2020 20:15 07/26/2020 08:43 07/26/2020 12:02  Glucose-Capillary Latest Ref Range: 70 - 99 mg/dL 165 (H) 268 (H) 268 (H) 255 (H) 327 (H) 206 (H)   Review of Glycemic Control  Diabetes history: DM2 Outpatient Diabetes medications: Glipizide 10 mg BID, Tradjenta 5 mg daily Current orders for Inpatient glycemic control: Novlog 0-20 units TIDw ith meals; Solumedrol 40 mg Q8H  Inpatient Diabetes Program Recommendations:    Insulin: If steroids are continued, please consider ordering Levemir 10 units Q24H.  Thanks, Barnie Alderman, RN, MSN, CDE Diabetes Coordinator Inpatient Diabetes Program [phone] (Team Pager from 8am to 5pm)

## 2020-07-26 NOTE — Progress Notes (Signed)
PROGRESS NOTE    Shawn May  [id number] DOB: 1931-12-19 DOA: 07/11/2020 PCP: [facility]    Assessment & Plan:   Principal Problem:   Sepsis ([place]) Active Problems:   AKI (acute kidney injury) ([place])   Dementia ([place])   Type 2 diabetes mellitus with stage 3 chronic kidney disease ([place])   Acute lower UTI   Shawn May is a 87 y.o. 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  Per patient's wife he has had poor oral intake for the last couple of days and has been increasingly weak.  Patient states that he has not had any falls at home.  Per EMS his room air pulse oximetry at rest was in the 80s and improved following oxygen supplementation to the low 90s.   Severe Sepsis from E coli UTI with complications of acute kidney injury POA  As evidenced by tachycardia, marked leukocytosis with a left shift, pyuria and elevated lactic acid level 2.1 Patient also noted to have worsening renal failure --Started patient on Rocephin 1 g IV daily --urine cx added on, grew pan-sensitive E coli Plan: --de-escalate to Keflex today  Acute on chronic kidney injury CKD3b --creatinine of 6.83 on admission compared to baseline of 2.46  --Patient followed by [facility] nephrology as an outpatient.  Suspect acute kidney injury due to severe volume depletion and dehydration.  CT for renal stone protocol was negative for hydronephrosis.   --Patient underwent urgent dialysis for significant azotemia and hyperkalemia.   Plan: --no further need for dialysis, per nephrology --Hold quinapril and hydrochlorothiazide --cont D5'@50'$ , per nephrology  Hyperkalemia, POA --resolved with dialysis --no further need for dialysis, per nephrology  COVID  infection --no respiratory symptoms, no hypoxia, CXR clear. --CRP 24.4 on presentation Plan: --cont solumedrol 40 mg q8h --Ensure CRP down-trending  Type 2 diabetes mellitus with stage III chronic kidney disease Uncontrolled --A1c 8.4 --hold home glipizide  --cont home Tradjenta --SSI TID resistant scale  Acute metabolic encephalopathy underlying dementia --sepsis, uremia, covid encephalopathy... Plan: --Hold Namenda and Aricept --order tele sitter  Dysphagia --SLP eval today, recommended Dys1 with Nectar thick fluid.  Hypernatremia --Na 146 on presentation, worsened to 151 despite being on hypotonic infusion, now finally trending down. Plan: --cont D5'@50'$ , per nephrology --encourage oral hydration   DVT prophylaxis: Heparin SQ Code Status: DNR  Family Communication:  Status is: inpatient Dispo:   The patient is from: home Anticipated d/c is to: undetermined Anticipated d/c date is: > 3 days Patient currently is not medically ready to d/c due to: unresponsive, encephalopathic   Subjective and Interval History:  Pt was awake, but not responding.       Objective: Vitals:   07/26/20 0507 07/26/20 0817 07/26/20 1114 07/26/20 1522  BP: 129/84 (!) 141/81 (!) 142/76 120/70  Pulse: 100 (!) 101 (!) 101 97  Resp: '14 20 20 14  '$ Temp: 97.9 F (36.6 C) 97.9 F (36.6 C) 97.7 F (36.5 C) (!) 96.8 F (36 C)  TempSrc: Oral Oral Oral Axillary  SpO2: 99% 96% 98% 100%  Weight:      Height:        Intake/Output Summary (Last 24 hours) at 07/26/2020 1626 Last data filed at 07/26/2020 1500 Gross per 24 hour  Intake 1432.17 ml  Output 950 ml  Net 482.17 ml   Filed Weights   07/23/2020 1056 07/25/20 0105  Weight: 72.6 kg 55.3 kg    Examination:   Constitutional: NAD, no talking or following commands CV: No cyanosis.   RESP: normal respiratory effort, on RA Extremities: No effusions, edema in BLE SKIN: warm, dry  Foley present   Data Reviewed: I have personally  reviewed following labs and imaging studies  CBC: Recent Labs  Lab 07/21/2020 1058 07/23/20 0407 07/24/20 0500 07/25/20 0527 07/26/20 0804  WBC 18.7* 15.9* 18.1* 16.6* 18.7*  NEUTROABS 16.2*  --   --   --   --   HGB 13.4 10.3* 10.9* 12.2* 11.1*  HCT 40.8 31.0* 32.4* 37.0* 34.4*  MCV 81.9 81.2 82.2 84.1 82.5  PLT 824* 583* 539* 519* 123XX123*   Basic Metabolic Panel: Recent Labs  Lab 07/27/2020 1058 07/07/2020 1417 07/19/2020 1615 07/13/2020 2147 07/23/20 0407 07/23/20 1215 07/24/20 0500 07/25/20 0527 07/26/20 0804  NA 146*  --  148*  --  146*  --  151* 152* 147*  K 7.1*   < > 5.5*   < > 4.3 4.2 3.8 3.9 3.3*  CL 109  --  109  --  102  --  105 107 106  CO2 18*  --  16*  --  27  --  '25 25 25  '$ GLUCOSE 324*  --  211*  --  123*  --  134* 207* 345*  BUN 167*  --  166*  --  108*  --  90* 75* 70*  CREATININE 6.83*  --  6.46*  --  4.33*  --  3.10* 2.81* 2.53*  CALCIUM 10.3  --  10.1  --  9.0  --  9.2 9.4 8.7*  MG 3.3*  --   --   --   --   --  2.1 2.0 1.9   < > = values in this interval not displayed.   GFR: Estimated Creatinine Clearance: 16.4 mL/min (A) (by C-G formula based on SCr of 2.53 mg/dL (H)). Liver Function Tests: Recent Labs  Lab 07/18/2020 1058  AST 40  ALT 38  ALKPHOS 97  BILITOT 1.3*  PROT 9.0*  ALBUMIN 3.6   No results for input(s): LIPASE, AMYLASE in the last 168 hours. No results for input(s): AMMONIA in the last 168 hours. Coagulation Profile: Recent Labs  Lab 07/23/20 0407  INR 1.1   Cardiac Enzymes: No results for input(s): CKTOTAL, CKMB, CKMBINDEX, TROPONINI in the last 168 hours. BNP (last 3 results) No results for input(s): PROBNP in the last 8760 hours. HbA1C: No results for input(s): HGBA1C in the last 72 hours. CBG: Recent Labs  Lab 07/25/20 1235 07/25/20 1747 07/25/20 2015 07/26/20 0843 07/26/20 1202  GLUCAP 268* 268* 255* 327* 206*   Lipid Profile: No results for input(s): CHOL, HDL, LDLCALC, TRIG, CHOLHDL, LDLDIRECT in the last 72  hours. Thyroid Function Tests: No results for input(s): TSH, T4TOTAL, FREET4, T3FREE, THYROIDAB in the last 72 hours. Anemia Panel: No results for input(s): VITAMINB12, FOLATE, FERRITIN, TIBC, IRON, RETICCTPCT in the last 72 hours. Sepsis Labs: Recent Labs  Lab 07/23/2020 1058 07/04/2020 1748 07/23/20 0407  PROCALCITON  --   --  1.95  LATICACIDVEN 2.1* 1.9  --     Recent Results (from the past 240 hour(s))  SARS Coronavirus 2 by RT PCR (hospital order, performed in [facility] hospital lab) Nasopharyngeal Nasopharyngeal Swab     Status: Abnormal   Collection Time: 07/30/2020  1:15 PM  Specimen: Nasopharyngeal Swab  Result Value Ref Range Status   SARS Coronavirus 2 POSITIVE (A) NEGATIVE Final    Comment: RESULT CALLED TO, READ BACK BY AND VERIFIED WITH: ROBIN REGISTER AT 1453 ON 08/01/2020 BY SS (NOTE) SARS-CoV-2 target nucleic acids are DETECTED  SARS-CoV-2 RNA is generally detectable in upper respiratory specimens  during the acute phase of infection.  Positive results are indicative  of the presence of the identified virus, but do not rule out bacterial infection or co-infection with other pathogens not detected by the test.  Clinical correlation with patient history and  other diagnostic information is necessary to determine patient infection status.  The expected result is negative.  Fact Sheet for Patients:   StrictlyIdeas.no   Fact Sheet for Healthcare Providers:   BankingDealers.co.za    This test is not yet approved or cleared by the [place] FDA and  has been authorized for detection and/or diagnosis of SARS-CoV-2 by FDA under an Emergency Use Authorization (EUA).  This EUA will remain in effect (meaning th is test can be used) for the duration of  the COVID-19 declaration under Section 564(b)(1) of the Act, 21 U.S.C. section 360-bbb-3(b)(1), unless the authorization is terminated or revoked sooner.  Performed at  [facility], [address] [place], [place] [address]   Culture, blood (single)     Status: None (Preliminary result)   Collection Time: 08/02/2020  2:17 PM   Specimen: BLOOD  Result Value Ref Range Status   Specimen Description BLOOD RIGHT ANTECUBITAL  Final   Special Requests   Final    BOTTLES DRAWN AEROBIC AND ANAEROBIC Blood Culture adequate volume   Culture   Final    NO GROWTH 4 DAYS Performed at [facility], [address] [place], [place] [address]    Report Status PENDING  Incomplete  Urine Culture     Status: Abnormal   Collection Time: 07/28/2020  2:17 PM   Specimen: Urine, Random  Result Value Ref Range Status   Specimen Description   Final    URINE, RANDOM Performed at [facility], [address], [place], [place] [address]    Special Requests   Final    NONE Performed at [facility], [address] [place], [place] [address]    Culture >=100,000 COLONIES/mL ESCHERICHIA COLI (A)  Final   Report Status 07/25/2020 FINAL  Final   Organism ID, Bacteria ESCHERICHIA COLI (A)  Final      Susceptibility   Escherichia coli - MIC*    AMPICILLIN <=2 SENSITIVE Sensitive     CEFAZOLIN <=4 SENSITIVE Sensitive     CEFEPIME <=0.12 SENSITIVE Sensitive     CEFTRIAXONE <=0.25 SENSITIVE Sensitive     CIPROFLOXACIN <=0.25 SENSITIVE Sensitive     GENTAMICIN <=1 SENSITIVE Sensitive     IMIPENEM <=0.25 SENSITIVE Sensitive     NITROFURANTOIN <=16 SENSITIVE Sensitive     TRIMETH/SULFA <=20 SENSITIVE Sensitive     AMPICILLIN/SULBACTAM <=2 SENSITIVE Sensitive     PIP/TAZO <=4 SENSITIVE Sensitive     * >=100,000 COLONIES/mL ESCHERICHIA COLI      Radiology Studies: No results found.   Scheduled Meds: . cephALEXin  250 mg Oral Q12H  . Chlorhexidine Gluconate Cloth  6 each Topical Q0600  . heparin  5,000 Units Subcutaneous Q8H  . insulin aspart  0-20 Units Subcutaneous TID WC  . linagliptin  5 mg Oral Daily  . methylPREDNISolone  (SOLU-MEDROL) injection  40 mg Intravenous Q8H   Continuous  Infusions: . dextrose 50 mL/hr at 07/25/20 0114     LOS: 4 days     Enzo Bi, MD [facility] If 7PM-7AM, please contact night-coverage 07/26/2020, 4:26 PM

## 2020-07-26 NOTE — Progress Notes (Signed)
[facility]  ROUNDING NOTE   Subjective:   D5W at 14m/hr  Continues to have poor oral intake.   Creatinine 2.53 (2.81) (3.1) Na 147 (152) (151)  UOP 550  Objective:  Vital signs in last 24 hours:  Temp:  [97.5 F (36.4 C)-97.9 F (36.6 C)] 97.7 F (36.5 C) (01/24 1114) Pulse Rate:  [100-105] 101 (01/24 1114) Resp:  [14-20] 20 (01/24 1114) BP: (125-142)/(76-93) 142/76 (01/24 1114) SpO2:  [96 %-100 %] 98 % (01/24 1114)  Weight change:  Filed Weights   07/18/2020 1056 07/25/20 0105  Weight: 72.6 kg 55.3 kg    Intake/Output: I/O last 3 completed shifts: In: 1774.6 [P.O.:237; I.V.:1137.6; IV Piggyback:400] Out: 550 [Urine:550]   Intake/Output this shift:  No intake/output data recorded.  Physical Exam: General: NAD, laying in bed  Head: Normocephalic, atraumatic. Dry oral mucosal membranes  Eyes: Anicteric, PERRL  Neck: Supple, trachea midline  Lungs:  Clear to auscultation  Heart: Regular rate and rhythm  Abdomen:  Soft, nontender,   Extremities:  no peripheral edema.  Neurologic: confused  Skin: No lesions   Access Right femoral temp HD catheter 1/20 Dr. DLucky Cowboy   Basic Metabolic Panel: Recent Labs  Lab 07/19/2020 1058 07/30/2020 1417 07/17/2020 1615 07/23/2020 2147 07/23/20 0407 07/23/20 1215 07/24/20 0500 07/25/20 0527 07/26/20 0804  NA 146*  --  148*  --  146*  --  151* 152* 147*  K 7.1*   < > 5.5*   < > 4.3 4.2 3.8 3.9 3.3*  CL 109  --  109  --  102  --  105 107 106  CO2 18*  --  16*  --  27  --  '25 25 25  '$ GLUCOSE 324*  --  211*  --  123*  --  134* 207* 345*  BUN 167*  --  166*  --  108*  --  90* 75* 70*  CREATININE 6.83*  --  6.46*  --  4.33*  --  3.10* 2.81* 2.53*  CALCIUM 10.3  --  10.1  --  9.0  --  9.2 9.4 8.7*  MG 3.3*  --   --   --   --   --  2.1 2.0 1.9   < > = values in this interval not displayed.    Liver Function Tests: Recent Labs  Lab 07/14/2020 1058  AST 40  ALT 38  ALKPHOS 97  BILITOT 1.3*  PROT 9.0*  ALBUMIN 3.6    No results for input(s): LIPASE, AMYLASE in the last 168 hours. No results for input(s): AMMONIA in the last 168 hours.  CBC: Recent Labs  Lab 07/14/2020 1058 07/23/20 0407 07/24/20 0500 07/25/20 0527 07/26/20 0804  WBC 18.7* 15.9* 18.1* 16.6* 18.7*  NEUTROABS 16.2*  --   --   --   --   HGB 13.4 10.3* 10.9* 12.2* 11.1*  HCT 40.8 31.0* 32.4* 37.0* 34.4*  MCV 81.9 81.2 82.2 84.1 82.5  PLT 824* 583* 539* 519* 490*    Cardiac Enzymes: No results for input(s): CKTOTAL, CKMB, CKMBINDEX, TROPONINI in the last 168 hours.  BNP: Invalid input(s): POCBNP  CBG: Recent Labs  Lab 07/25/20 1235 07/25/20 1747 07/25/20 2015 07/26/20 0843 07/26/20 1202  GLUCAP 268* 268* 255* 327* 206*    Microbiology: Results for orders placed or performed during the hospital encounter of 07/27/2020  SARS Coronavirus 2 by RT PCR (hospital [facility]) Nasopharyngeal Nasopharyngeal Swab     Status:  Abnormal   Collection Time: 07/13/2020  1:15 PM   Specimen: Nasopharyngeal Swab  Result Value Ref Range Status   SARS Coronavirus 2 POSITIVE (A) NEGATIVE Final    Comment: RESULT CALLED TO, READ BACK BY AND VERIFIED WITH: ROBIN REGISTER AT 1453 ON 07/14/2020 BY SS (NOTE) SARS-CoV-2 target nucleic acids are DETECTED  SARS-CoV-2 RNA is generally detectable in upper respiratory specimens  during the acute phase of infection.  Positive results are indicative  of the presence of the identified virus, but do not rule out bacterial infection or co-infection with other pathogens not detected by the test.  Clinical correlation with patient history and  other diagnostic information is necessary to determine patient infection status.  The expected result is negative.  Fact Sheet for Patients:   StrictlyIdeas.no   Fact Sheet for Healthcare Providers:   BankingDealers.co.za    This test is not yet approved or cleared by the [place] FDA and  has been authorized for detection and/or diagnosis of SARS-CoV-2 by FDA under an Emergency Use Authorization (EUA).  This EUA will remain in effect (meaning th is test can be used) for the duration of  the COVID-19 declaration under Section 564(b)(1) of the Act, 21 U.S.C. section 360-bbb-3(b)(1), unless the authorization is terminated or revoked sooner.  Performed at [facility], [address] [place], [place] [address]   Culture, blood (single)     Status: None (Preliminary result)   Collection Time: 07/30/2020  2:17 PM   Specimen: BLOOD  Result Value Ref Range Status   Specimen Description BLOOD RIGHT ANTECUBITAL  Final   Special Requests   Final    BOTTLES DRAWN AEROBIC AND ANAEROBIC Blood Culture adequate volume   Culture   Final    NO GROWTH 4 DAYS Performed at [facility], [address] [place], [place] [address]    Report Status PENDING  Incomplete  Urine Culture     Status: Abnormal   Collection Time: 07/13/2020  2:17 PM   Specimen: Urine, Random  Result Value Ref Range Status   Specimen Description   Final    URINE, RANDOM Performed at [facility], [address], [place], [place] [address]    Special Requests   Final    NONE Performed at [facility], [place],  [address]    Culture >=100,000 COLONIES/mL ESCHERICHIA COLI (A)  Final   Report Status 07/25/2020 FINAL  Final   Organism ID, Bacteria ESCHERICHIA COLI (A)  Final      Susceptibility   Escherichia coli - MIC*    AMPICILLIN <=2 SENSITIVE Sensitive     CEFAZOLIN <=4 SENSITIVE Sensitive     CEFEPIME <=0.12 SENSITIVE Sensitive     CEFTRIAXONE <=0.25 SENSITIVE Sensitive     CIPROFLOXACIN <=0.25 SENSITIVE Sensitive     GENTAMICIN <=1 SENSITIVE Sensitive     IMIPENEM <=0.25 SENSITIVE Sensitive     NITROFURANTOIN <=16 SENSITIVE Sensitive     TRIMETH/SULFA <=20 SENSITIVE Sensitive     AMPICILLIN/SULBACTAM <=2 SENSITIVE Sensitive      PIP/TAZO <=4 SENSITIVE Sensitive     * >=100,000 COLONIES/mL ESCHERICHIA COLI    Coagulation Studies: No results for input(s): LABPROT, INR in the last 72 hours.  Urinalysis: No results for input(s): COLORURINE, LABSPEC, PHURINE, GLUCOSEU, HGBUR, BILIRUBINUR, KETONESUR, PROTEINUR, UROBILINOGEN, NITRITE, LEUKOCYTESUR in the last 72 hours.  Invalid input(s): APPERANCEUR    Imaging: No results found.   Medications:   . cefTRIAXone (ROCEPHIN)  IV  Stopped (07/25/20 2202)  . dextrose 50 mL/hr at 07/25/20 0114  . potassium chloride 10 mEq (07/26/20 1222)   . Chlorhexidine Gluconate Cloth  6 each Topical Q0600  . heparin  5,000 Units Subcutaneous Q8H  . insulin aspart  0-20 Units Subcutaneous TID WC  . methylPREDNISolone (SOLU-MEDROL) injection  40 mg Intravenous Q8H   acetaminophen **OR** acetaminophen, haloperidol lactate, hydrALAZINE, ondansetron **OR** ondansetron (ZOFRAN) IV  Assessment/ Plan:  Mr. MATS HEASTER is a 87 y.o. black male with dementia, diabetes mellitus type 2, hyperlipidemia, hypertension, who was admitted to [facility] on 07/06/2020 for evaluation of shortness of breath and weakness.  1.  Acute kidney injury on chronic kidney disease stage IV with proteinuria: baseline creatinine of 2.19, GFR of 26 on 12/25/2019.  Followed by [facility] nephrology as an outpatient.   Suspect acute kidney injury now due to severe volume depletion and dehydration.   Emergent hemodialysis on admission.  Chronic kidney disease secondary to diabetic nephropathy - no indication for further dialysis. Nonoliguric urine output.  - Discontinue HD catheter  2.  Hypernatremia: secondary to free water deficit 1.4 liters - Continue dextrose infusion - Encourage PO intake.     LOS: 4 Haze Antillon 1/24/20221:18 PM

## 2020-07-26 NOTE — Progress Notes (Addendum)
Speech Language Pathology Treatment: Dysphagia  Patient Details Name: RHYKER METH MRN: [id number] DOB: Aug 14, 1931 Today's Date: 07/26/2020 Time: [id number] SLP Time Calculation (min) (ACUTE ONLY): 45 min  Assessment / Plan / Recommendation Clinical Impression  Pt seen for ongoing assessment of swallowing; toleration of Dysphagia diet initiated at eval. Pt appears to present w/ oropharyngeal phase dysphagia in light of Significantly declined Cognitive status; Dementia. This impacts his overall awareness/engagement and safety during po tasks which increases risk for aspiration, choking. Pt's risk for aspiration, as well as concern of meeting nutritional needs fully, is present but is reduced when following general aspiration precautions, given feeding assistance, and using a Modified diet. He requires Mod tactile/verbal/ visual cues for orientation to bolus presentation, and during feeding support(eyes open this session); he often needed visual and tactile cues of spoon at lips to open mouth for the po trials. Pt consumed trials of Nectar liquids VIA TSP and purees w/ No immediate, overt clinical s/s of aspiration noted; no decline in respiratory status during/post trials. Delayed congested cough noted x2 after several trials given. Time given to calm coughing then he consumed few more trials. No increased respiratory effort noted post trials; vocal quality clear/low.  Oral phase was c/b poor oral prep awareness to accept and manage boluses; min slow A-P transfer and swallow; oral clearing achieved w/ boluses given Time w/ each trial.  D/t pt's declined Cognitive status and his risk for aspiration, recommend continuing a dysphagia level 1(puree) w/ Nectar liquids byt TSP as needed to lessen risk for aspiration; aspiration precautions; reduce Distractions during meals and only give po's when pt is alert, participative. Pills Crushed in Puree for safer swallowing. Support w/ feeding at meals -- check for  oral clearing during/post intake. NSG updated. ST services recommends follow w/ Palliative Care for [place]. ST services feels this could be the most beneficial diet for pt d/t risk for aspiration; baseline Dementia present. ST services can be available for further f/u if pt becomes ready for any upgrade of diet while admitted. Largely suspect that pt's Dementia could hamper oral intake and upgrade of diet. Precautions posted in room. MD/NSG updated.    HPI HPI: Pt is a 87 y.o. male with medical history significant for dementia??  Alzheimer's versus vascular, history of CVA, diabetes mellitus with complications of stage III chronic kidney disease, hypertension and dyslipidemia who was brought into the ER by EMS for evaluation of shortness of breath and weakness.  Per patient's wife he has had poor oral intake for the last couple of days and has been increasingly weak.  Patient states that he has not had any falls at home.  Per EMS his room air pulse oximetry at rest was in the 80s and improved following oxygen supplementation to the low 90s.  Patient is agitated and unable to provide any history at this time. Nephrology was consulted by emergency room physician and a temporary dialysis catheter was inserted for hemodialysis.  Patient appears to be septic most likely from urinary source and received broad-spectrum antibiotic therapy with vancomycin and cefepime.  He will be admitted to the hospital for further evaluation.      SLP Plan  Continue with current plan of care       Recommendations  Diet recommendations: Dysphagia 1 (puree);Nectar-thick liquid Liquids provided via: Teaspoon Medication Administration: Crushed with puree (as able) Supervision: Staff to assist with self feeding;Full supervision/cueing for compensatory strategies Compensations: Minimize environmental distractions;Slow rate;Small sips/bites;Lingual sweep for clearance of pocketing;Multiple dry  swallows after each bite/sip;Follow  solids with liquid Postural Changes and/or Swallow Maneuvers: Seated upright 90 degrees;Upright 30-60 min after meal                General recommendations:  (Dietician f/u) Oral Care Recommendations: Oral care BID;Oral care before and after PO;Staff/trained caregiver to provide oral care Follow up Recommendations:  (TBD) SLP Visit Diagnosis: Dysphagia, oropharyngeal phase (R13.12) Plan: Continue with current plan of care       GO                 Orinda Kenner, [place], [place] Pathologist Rehab Services [phone] [facility] 07/27/2083, 5:20 PM

## 2020-07-27 DIAGNOSIS — R6520 Severe sepsis without septic shock: Secondary | ICD-10-CM | POA: Diagnosis not present

## 2020-07-27 DIAGNOSIS — F0391 Unspecified dementia with behavioral disturbance: Secondary | ICD-10-CM | POA: Diagnosis not present

## 2020-07-27 DIAGNOSIS — A419 Sepsis, unspecified organism: Secondary | ICD-10-CM | POA: Diagnosis not present

## 2020-07-27 DIAGNOSIS — N179 Acute kidney failure, unspecified: Secondary | ICD-10-CM | POA: Diagnosis not present

## 2020-07-27 LAB — BASIC METABOLIC PANEL
Anion gap: 15 (ref 5–15)
BUN: 72 mg/dL — ABNORMAL HIGH (ref 8–23)
CO2: 24 mmol/L (ref 22–32)
Calcium: 8.6 mg/dL — ABNORMAL LOW (ref 8.9–10.3)
Chloride: 106 mmol/L (ref 98–111)
Creatinine, Ser: 2.45 mg/dL — ABNORMAL HIGH (ref 0.61–1.24)
GFR, Estimated: 25 mL/min — ABNORMAL LOW (ref >60)
Glucose, Bld: 322 mg/dL — ABNORMAL HIGH (ref 70–99)
Potassium: 3.7 mmol/L (ref 3.5–5.1)
Sodium: 145 mmol/L (ref 135–145)

## 2020-07-27 LAB — GLUCOSE, CAPILLARY
Glucose-Capillary: 295 mg/dL — ABNORMAL HIGH (ref 70–99)
Glucose-Capillary: 326 mg/dL — ABNORMAL HIGH (ref 70–99)
Glucose-Capillary: 191 mg/dL — ABNORMAL HIGH (ref 70–99)
Glucose-Capillary: 300 mg/dL — ABNORMAL HIGH (ref 70–99)

## 2020-07-27 LAB — MAGNESIUM: Magnesium: 1.9 mg/dL (ref 1.7–2.4)

## 2020-07-27 LAB — CBC
HCT: 36.4 % — ABNORMAL LOW (ref 39.0–52.0)
Hemoglobin: 12 g/dL — ABNORMAL LOW (ref 13.0–17.0)
MCH: 27.5 pg (ref 26.0–34.0)
MCHC: 33 g/dL (ref 30.0–36.0)
MCV: 83.3 fL (ref 80.0–100.0)
Platelets: 465 10*3/uL — ABNORMAL HIGH (ref 150–400)
RBC: 4.37 MIL/uL (ref 4.22–5.81)
RDW: 13.8 % (ref 11.5–15.5)
WBC: 19.8 10*3/uL — ABNORMAL HIGH (ref 4.0–10.5)
nRBC: 0 % (ref 0.0–0.2)

## 2020-07-27 LAB — CULTURE, BLOOD (SINGLE)
Culture: NO GROWTH
Special Requests: ADEQUATE

## 2020-07-27 LAB — C-REACTIVE PROTEIN: CRP: 9.1 mg/dL — ABNORMAL HIGH (ref <1.0)

## 2020-07-27 MED ORDER — INSULIN GLARGINE 100 UNIT/ML ~~LOC~~ SOLN
10.0000 [IU] | Freq: Every day | SUBCUTANEOUS | Status: DC
  Administered 2020-07-27 – 2020-07-28 (×2): 10 [IU] via SUBCUTANEOUS
  Filled 2020-07-27 (×3): qty 0.1

## 2020-07-27 NOTE — Progress Notes (Signed)
[facility]  ROUNDING NOTE   Subjective:   D5W at 70m/hr  Continues to have poor oral intake.   Creatinine 2.45 (2.53) (2.81) (3.1) Na 145 (147) (152) (151)  UOP 900  Dialysis catheter removed yesterday  Objective:  Vital signs in last 24 hours:  Temp:  [96.8 F (36 C)-98.3 F (36.8 C)] 97.7 F (36.5 C) (01/25 0723) Pulse Rate:  [95-114] 95 (01/25 0723) Resp:  [14-20] 16 (01/25 0723) BP: (120-156)/(70-122) 145/87 (01/25 0723) SpO2:  [98 %-100 %] 99 % (01/25 0723)  Weight change:  Filed Weights   07/09/2020 1056 07/25/20 0105  Weight: 72.6 kg 55.3 kg    Intake/Output: I/O last 3 completed shifts: In: 1432.2 [I.V.:1134.9; IV Piggyback:297.3] Out: [id number][Urine:1450]   Intake/Output this shift:  No intake/output data recorded.  Physical Exam: General: NAD, laying in bed  Head: Normocephalic, atraumatic. Dry oral mucosal membranes  Eyes: Anicteric, PERRL  Neck: Supple, trachea midline  Lungs:  Clear to auscultation  Heart: Regular rate and rhythm  Abdomen:  Soft, nontender,   Extremities:  no peripheral edema.  Neurologic: confused  Skin: No lesions   Access Right femoral temp HD catheter 1/20 Dr. DLucky Cowboy   Basic Metabolic Panel: Recent Labs  Lab 07/23/2020 1058 07/21/2020 1417 07/23/20 0407 07/23/20 1215 07/24/20 0500 07/25/20 0527 07/26/20 0804 07/27/20 0731  NA 146*   < > 146*  --  151* 152* 147* 145  K 7.1*   < > 4.3 4.2 3.8 3.9 3.3* 3.7  CL 109   < > 102  --  105 107 106 106  CO2 18*   < > 27  --  '25 25 25 24  '$ GLUCOSE 324*   < > 123*  --  134* 207* 345* 322*  BUN 167*   < > 108*  --  90* 75* 70* 72*  CREATININE 6.83*   < > 4.33*  --  3.10* 2.81* 2.53* 2.45*  CALCIUM 10.3   < > 9.0  --  9.2 9.4 8.7* 8.6*  MG 3.3*  --   --   --  2.1 2.0 1.9 1.9   < > = values in this interval not displayed.    Liver Function Tests: Recent Labs  Lab 07/21/2020 1058  AST 40  ALT 38  ALKPHOS 97  BILITOT 1.3*  PROT 9.0*  ALBUMIN 3.6   No results  for input(s): LIPASE, AMYLASE in the last 168 hours. No results for input(s): AMMONIA in the last 168 hours.  CBC: Recent Labs  Lab 07/21/2020 1058 07/23/20 0407 07/24/20 0500 07/25/20 0527 07/26/20 0804 07/27/20 0731  WBC 18.7* 15.9* 18.1* 16.6* 18.7* 19.8*  NEUTROABS 16.2*  --   --   --   --   --   HGB 13.4 10.3* 10.9* 12.2* 11.1* 12.0*  HCT 40.8 31.0* 32.4* 37.0* 34.4* 36.4*  MCV 81.9 81.2 82.2 84.1 82.5 83.3  PLT 824* 583* 539* 519* 490* 465*    Cardiac Enzymes: No results for input(s): CKTOTAL, CKMB, CKMBINDEX, TROPONINI in the last 168 hours.  BNP: Invalid input(s): POCBNP  CBG: Recent Labs  Lab 07/25/20 2015 07/26/20 0843 07/26/20 1202 07/26/20 1644 07/27/20 0839  GLUCAP 255* 327* 206* 140* 295*    Microbiology: Results for orders placed or performed during the hospital encounter of 07/06/2020  SARS Coronavirus 2 by RT PCR (hospital order[facility]) Nasopharyngeal Nasopharyngeal Swab     Status: Abnormal   Collection Time: 07/27/2020  1:15 PM  Specimen: Nasopharyngeal Swab  Result Value Ref Range Status   SARS Coronavirus 2 POSITIVE (A) NEGATIVE Final    Comment: RESULT CALLED TO, READ BACK BY AND VERIFIED WITH: ROBIN REGISTER AT 1453 ON 07/15/2020 BY SS (NOTE) SARS-CoV-2 target nucleic acids are DETECTED  SARS-CoV-2 RNA is generally detectable in upper respiratory specimens  during the acute phase of infection.  Positive results are indicative  of the presence of the identified virus, but do not rule out bacterial infection or co-infection with other pathogens not detected by the test.  Clinical correlation with patient history and  other diagnostic information is necessary to determine patient infection status.  The expected result is negative.  Fact Sheet for Patients:   StrictlyIdeas.no   Fact Sheet for Healthcare Providers:   BankingDealers.co.za    This test is not yet  approved or cleared by the [place] FDA and  has been authorized for detection and/or diagnosis of SARS-CoV-2 by FDA under an Emergency Use Authorization (EUA).  This EUA will remain in effect (meaning th is test can be used) for the duration of  the COVID-19 declaration under Section 564(b)(1) of the Act, 21 U.S.C. section 360-bbb-3(b)(1), unless the authorization is terminated or revoked sooner.  Performed at [facility], [address] [place], [place] [address]   Culture, blood (single)     Status: None   Collection Time: 07/09/2020  2:17 PM   Specimen: BLOOD  Result Value Ref Range Status   Specimen Description BLOOD RIGHT ANTECUBITAL  Final   Special Requests   Final    BOTTLES DRAWN AEROBIC AND ANAEROBIC Blood Culture adequate volume   Culture   Final    NO GROWTH 5 DAYS Performed at [facility], [address] [place], [place] [address]    Report Status 07/27/2020 FINAL  Final  Urine Culture     Status: Abnormal   Collection Time: 08/01/2020  2:17 PM   Specimen: Urine, Random  Result Value Ref Range Status   Specimen Description   Final    URINE, RANDOM Performed at [facility], [address] [place], [place] [address]    Special Requests   Final    NONE Performed at [facility], [place],  [address]    Culture >=100,000 COLONIES/mL ESCHERICHIA COLI (A)  Final   Report Status 07/25/2020 FINAL  Final   Organism ID, Bacteria ESCHERICHIA COLI (A)  Final      Susceptibility   Escherichia coli - MIC*    AMPICILLIN <=2 SENSITIVE Sensitive     CEFAZOLIN <=4 SENSITIVE Sensitive     CEFEPIME <=0.12 SENSITIVE Sensitive     CEFTRIAXONE <=0.25 SENSITIVE Sensitive     CIPROFLOXACIN <=0.25 SENSITIVE Sensitive     GENTAMICIN <=1 SENSITIVE Sensitive     IMIPENEM <=0.25 SENSITIVE Sensitive     NITROFURANTOIN <=16 SENSITIVE Sensitive     TRIMETH/SULFA <=20 SENSITIVE Sensitive     AMPICILLIN/SULBACTAM <=2  SENSITIVE Sensitive     PIP/TAZO <=4 SENSITIVE Sensitive     * >=100,000 COLONIES/mL ESCHERICHIA COLI    Coagulation Studies: No results for input(s): LABPROT, INR in the last 72 hours.  Urinalysis: No results for input(s): COLORURINE, LABSPEC, PHURINE, GLUCOSEU, HGBUR, BILIRUBINUR, KETONESUR, PROTEINUR, UROBILINOGEN, NITRITE, LEUKOCYTESUR in the last 72 hours.  Invalid input(s): APPERANCEUR    Imaging: No results found.   Medications:   . dextrose 50 mL/hr at 07/26/20 1655   . cephALEXin  250 mg Oral Q12H  .  Chlorhexidine Gluconate Cloth  6 each Topical Q0600  . heparin  5,000 Units Subcutaneous Q8H  . insulin aspart  0-20 Units Subcutaneous TID WC  . linagliptin  5 mg Oral Daily  . methylPREDNISolone (SOLU-MEDROL) injection  40 mg Intravenous Q8H   acetaminophen **OR** acetaminophen, haloperidol lactate, hydrALAZINE, ondansetron **OR** ondansetron (ZOFRAN) IV  Assessment/ Plan:  Shawn May is a 87 y.o. black male with dementia, diabetes mellitus type 2, hyperlipidemia, hypertension, who was admitted to [facility] on 07/13/2020 for evaluation of shortness of breath and weakness.  1.  Acute kidney injury on chronic kidney disease stage IV with proteinuria: baseline creatinine of 2.19, GFR of 26 on 12/25/2019.  Followed by [facility] nephrology as an outpatient.   Suspect acute kidney injury now due to severe volume depletion and dehydration.   Emergent hemodialysis on admission.  Chronic kidney disease secondary to diabetic nephropathy - no indication for further dialysis. Nonoliguric urine output.   2.  Hypernatremia: secondary to free water deficit 1 liter - Continue dextrose infusion for one more day.  - Encourage PO intake.     LOS: 5 Conny Moening 1/25/202210:07 AM

## 2020-07-27 NOTE — Progress Notes (Incomplete)
PROGRESS NOTE    Shawn May  [id number] DOB: [birth year] DOA: 07/21/2020 PCP: Center, [facility]    Assessment & Plan:   Principal Problem:   Sepsis (Shawn May) Active Problems:   AKI (acute kidney injury) (Shawn May)   Dementia (Shawn May)   Type 2 diabetes mellitus with stage 3 chronic kidney disease (Shawn May)   Acute lower UTI   Shawn May is a 87 y.o. 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  Per patient's wife he has had poor oral intake for the last couple of days and has been increasingly weak.  Patient states that he has not had any falls at home.  Per EMS his room air pulse oximetry at rest was in the 80s and improved following oxygen supplementation to the low 90s.   Severe Sepsis from E coli UTI with complications of acute kidney injury POA  As evidenced by tachycardia, marked leukocytosis with a left shift, pyuria and elevated lactic acid level 2.1 Patient also noted to have worsening renal failure --Started patient on Rocephin 1 g IV daily --urine cx added on, grew pan-sensitive E coli Plan: --de-escalate to Keflex today  Acute on chronic kidney injury CKD3b --creatinine of 6.83 on admission compared to baseline of 2.46  --Patient followed by [facility] nephrology as an outpatient.  Suspect acute kidney injury due to severe volume depletion and dehydration.  CT for renal stone protocol was negative for hydronephrosis.   --Patient underwent urgent dialysis for significant azotemia and hyperkalemia.   Plan: --no further need for dialysis, per nephrology --Hold quinapril and hydrochlorothiazide --cont D5'@50'$ , per nephrology  Hyperkalemia, POA --resolved with dialysis --no further need for dialysis, per nephrology  COVID  infection --no respiratory symptoms, no hypoxia, CXR clear. --CRP 24.4 on presentation Plan: --cont solumedrol 40 mg q8h --Ensure CRP down-trending  Type 2 diabetes mellitus with stage III chronic kidney disease Uncontrolled --A1c 8.4 --hold home glipizide  --cont home Tradjenta --SSI TID resistant scale  Acute metabolic encephalopathy underlying dementia --sepsis, uremia, covid encephalopathy... Plan: --Hold Namenda and Aricept --order tele sitter  Dysphagia --SLP eval today, recommended Dys1 with Nectar thick fluid.  Hypernatremia --Na 146 on presentation, worsened to 151 despite being on hypotonic infusion, now finally trending down. Plan: --cont D5'@50'$ , per nephrology --encourage oral hydration   DVT prophylaxis: Heparin SQ Code Status: DNR  Family Communication:  Status is: inpatient Dispo:   The patient is from: home Anticipated d/c is to: undetermined Anticipated d/c date is: > 3 days Patient currently is not medically ready to d/c due to: unresponsive, encephalopathic   Subjective and Interval History:  Pt was more alert today, and said yes to water.      Objective: Vitals:   07/27/20 0344 07/27/20 0723 07/27/20 1128 07/27/20 1525  BP: (!) 156/88 (!) 145/87 (!) 146/95 129/89  Pulse: 97 95 94 85  Resp: '20 16 20 20  '$ Temp: 98.3 F (36.8 C) 97.7 F (36.5 C) 97.8 F (36.6 C) (!) 97.5 F (36.4 C)  TempSrc: Oral Oral  Oral  SpO2: 100% 99% 100% 97%  Weight:      Height:        Intake/Output Summary (Last 24 hours) at 07/27/2020 1744 Last data filed at 07/27/2020 1015 Gross per 24 hour  Intake -  Output 800  ml  Net -800 ml   Filed Weights   07/27/2020 1056 07/25/20 0105  Weight: 72.6 kg 55.3 kg    Examination:   Constitutional: NAD, AAOx3 HEENT: conjunctivae and lids normal, EOMI CV: No cyanosis.   RESP: normal respiratory effort, on RA Extremities: No effusions, edema in BLE SKIN: warm, dry Neuro: II - XII grossly intact.   Psych: Normal  mood and affect.  Appropriate judgement and reason   Foley present   Data Reviewed: I have personally reviewed following labs and imaging studies  CBC: Recent Labs  Lab 07/17/2020 1058 07/23/20 0407 07/24/20 0500 07/25/20 0527 07/26/20 0804 07/27/20 0731  WBC 18.7* 15.9* 18.1* 16.6* 18.7* 19.8*  NEUTROABS 16.2*  --   --   --   --   --   HGB 13.4 10.3* 10.9* 12.2* 11.1* 12.0*  HCT 40.8 31.0* 32.4* 37.0* 34.4* 36.4*  MCV 81.9 81.2 82.2 84.1 82.5 83.3  PLT 824* 583* 539* 519* 490* 123XX123*   Basic Metabolic Panel: Recent Labs  Lab 08/01/2020 1058 07/07/2020 1417 07/23/20 0407 07/23/20 1215 07/24/20 0500 07/25/20 0527 07/26/20 0804 07/27/20 0731  NA 146*   < > 146*  --  151* 152* 147* 145  K 7.1*   < > 4.3 4.2 3.8 3.9 3.3* 3.7  CL 109   < > 102  --  105 107 106 106  CO2 18*   < > 27  --  '25 25 25 24  '$ GLUCOSE 324*   < > 123*  --  134* 207* 345* 322*  BUN 167*   < > 108*  --  90* 75* 70* 72*  CREATININE 6.83*   < > 4.33*  --  3.10* 2.81* 2.53* 2.45*  CALCIUM 10.3   < > 9.0  --  9.2 9.4 8.7* 8.6*  MG 3.3*  --   --   --  2.1 2.0 1.9 1.9   < > = values in this interval not displayed.   GFR: Estimated Creatinine Clearance: 16.9 mL/min (A) (by C-G formula based on SCr of 2.45 mg/dL (H)). Liver Function Tests: Recent Labs  Lab 07/16/2020 1058  AST 40  ALT 38  ALKPHOS 97  BILITOT 1.3*  PROT 9.0*  ALBUMIN 3.6   No results for input(s): LIPASE, AMYLASE in the last 168 hours. No results for input(s): AMMONIA in the last 168 hours. Coagulation Profile: Recent Labs  Lab 07/23/20 0407  INR 1.1   Cardiac Enzymes: No results for input(s): CKTOTAL, CKMB, CKMBINDEX, TROPONINI in the last 168 hours. BNP (last 3 results) No results for input(s): PROBNP in the last 8760 hours. HbA1C: No results for input(s): HGBA1C in the last 72 hours. CBG: Recent Labs  Lab 07/26/20 1202 07/26/20 1644 07/27/20 0839 07/27/20 1125 07/27/20 1524  GLUCAP 206* 140* 295* 326* 191*   Lipid  Profile: No results for input(s): CHOL, HDL, LDLCALC, TRIG, CHOLHDL, LDLDIRECT in the last 72 hours. Thyroid Function Tests: No results for input(s): TSH, T4TOTAL, FREET4, T3FREE, THYROIDAB in the last 72 hours. Anemia Panel: No results for input(s): VITAMINB12, FOLATE, FERRITIN, TIBC, IRON, RETICCTPCT in the last 72 hours. Sepsis Labs: Recent Labs  Lab 07/30/2020 1058 07/07/2020 1748 07/23/20 0407  PROCALCITON  --   --  1.95  LATICACIDVEN 2.1* 1.9  --     Recent Results (from the past 240 hour(s))  SARS Coronavirus 2 by RT PCR (hospital order, performed in [facility] hospital lab) Nasopharyngeal Nasopharyngeal Swab     Status: Abnormal  Collection Time: 07/10/2020  1:15 PM   Specimen: Nasopharyngeal Swab  Result Value Ref Range Status   SARS Coronavirus 2 POSITIVE (A) NEGATIVE Final    Comment: RESULT CALLED TO, READ BACK BY AND VERIFIED WITH: ROBIN REGISTER AT 1453 ON 07/14/2020 BY SS (NOTE) SARS-CoV-2 target nucleic acids are DETECTED  SARS-CoV-2 RNA is generally detectable in upper respiratory specimens  during the acute phase of infection.  Positive results are indicative  of the presence of the identified virus, but do not rule out bacterial infection or co-infection with other pathogens not detected by the test.  Clinical correlation with patient history and  other diagnostic information is necessary to determine patient infection status.  The expected result is negative.  Fact Sheet for Patients:   StrictlyIdeas.no   Fact Sheet for Healthcare Providers:   BankingDealers.co.za    This test is not yet approved or cleared by the [place] FDA and  has been authorized for detection and/or diagnosis of SARS-CoV-2 by FDA under an Emergency Use Authorization (EUA).  This EUA will remain in effect (meaning th is test can be used) for the duration of  the COVID-19 declaration under Section 564(b)(1) of the Act, 21 U.S.C. section  360-bbb-3(b)(1), unless the authorization is terminated or revoked sooner.  Performed at [facility], [address] [place], [place] [address]   Culture, blood (single)     Status: None   Collection Time: 07/30/2020  2:17 PM   Specimen: BLOOD  Result Value Ref Range Status   Specimen Description BLOOD RIGHT ANTECUBITAL  Final   Special Requests   Final    BOTTLES DRAWN AEROBIC AND ANAEROBIC Blood Culture adequate volume   Culture   Final    NO GROWTH 5 DAYS Performed at [facility], [address] [place], [place] [address]    Report Status 07/27/2020 FINAL  Final  Urine Culture     Status: Abnormal   Collection Time: 07/03/2020  2:17 PM   Specimen: Urine, Random  Result Value Ref Range Status   Specimen Description   Final    URINE, RANDOM Performed at [facility], [address], [place], [place] [address]    Special Requests   Final    NONE Performed at [facility], [address] [place], [place] [address]    Culture >=100,000 COLONIES/mL ESCHERICHIA COLI (A)  Final   Report Status 07/25/2020 FINAL  Final   Organism ID, Bacteria ESCHERICHIA COLI (A)  Final      Susceptibility   Escherichia coli - MIC*    AMPICILLIN <=2 SENSITIVE Sensitive     CEFAZOLIN <=4 SENSITIVE Sensitive     CEFEPIME <=0.12 SENSITIVE Sensitive     CEFTRIAXONE <=0.25 SENSITIVE Sensitive     CIPROFLOXACIN <=0.25 SENSITIVE Sensitive     GENTAMICIN <=1 SENSITIVE Sensitive     IMIPENEM <=0.25 SENSITIVE Sensitive     NITROFURANTOIN <=16 SENSITIVE Sensitive     TRIMETH/SULFA <=20 SENSITIVE Sensitive     AMPICILLIN/SULBACTAM <=2 SENSITIVE Sensitive     PIP/TAZO <=4 SENSITIVE Sensitive     * >=100,000 COLONIES/mL ESCHERICHIA COLI      Radiology Studies: No results found.   Scheduled Meds: . cephALEXin  250 mg Oral Q12H  . Chlorhexidine Gluconate Cloth  6 each Topical Q0600  . heparin  5,000 Units Subcutaneous Q8H  . insulin aspart  0-20 Units  Subcutaneous TID WC  . insulin glargine  10 Units Subcutaneous Daily  . linagliptin  5  mg Oral Daily  . methylPREDNISolone (SOLU-MEDROL) injection  40 mg Intravenous Q8H   Continuous Infusions: . dextrose 50 mL/hr at 07/26/20 1655     LOS: 5 days     Enzo Bi, MD [facility] If 7PM-7AM, please contact night-coverage 07/27/2020, 5:44 PM

## 2020-07-27 NOTE — Progress Notes (Addendum)
PROGRESS NOTE    Shawn May  [id number] DOB: Sep 14, 1931 DOA: 07/26/2020 PCP: Center, [facility]    Assessment & Plan:   Principal Problem:   Sepsis ([place]) Active Problems:   AKI (acute kidney injury) ([place])   Dementia ([place])   Type 2 diabetes mellitus with stage 3 chronic kidney disease ([place])   Acute lower UTI   Shawn May is a 87 y.o. 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  Per patient's wife he has had poor oral intake for the last couple of days and has been increasingly weak.  Patient states that he has not had any falls at home.  Per EMS his room air pulse oximetry at rest was in the 80s and improved following oxygen supplementation to the low 90s.   Severe Sepsis from E coli UTI with complications of acute kidney injury POA  As evidenced by tachycardia, marked leukocytosis with a left shift, pyuria and elevated lactic acid level 2.1 Patient also noted to have worsening renal failure --Started patient on Rocephin 1 g IV daily --urine cx added on, grew pan-sensitive E coli, abx de-escalated to Keflex  Plan: --cont Keflex for a 7-day course, last day 1/26.  Acute on chronic kidney injury CKD4 --creatinine of 6.83 on admission compared to baseline of 2.46  --Patient followed by [facility] nephrology as an outpatient.  Suspect acute kidney injury due to severe volume depletion and dehydration.  CT for renal stone protocol was negative for hydronephrosis.   --Patient underwent urgent dialysis for significant azotemia and hyperkalemia.   Plan: --no further need for dialysis, per nephrology --Hold quinapril and hydrochlorothiazide --cont D5'@50'$  for 1 more day, per nephrology  Hyperkalemia, POA --resolved with  dialysis --no further need for dialysis, per nephrology  COVID infection --no respiratory symptoms, no hypoxia, CXR clear. --CRP 24.4 on presentation, trending down now with steroid Plan: --cont solumedrol 40 mg q8h --Ensure CRP down-trending  Type 2 diabetes mellitus with stage III chronic kidney disease Uncontrolled --A1c 8.4 --hold home glipizide  --cont home Tradjenta --SSI TID resistant scale --add Lantus 10u daily  Acute metabolic encephalopathy underlying dementia --sepsis, uremia, covid encephalopathy... Plan: --Hold Namenda and Aricept --IV haldol PRN --palliative consult  Dysphagia --SLP eval, recommended Dys1 with Nectar thick fluid.  Hypernatremia --Na 146 on presentation, worsened to 151 despite being on hypotonic infusion, now finally trending down. Plan: --cont D5'@50'$  for 1 more day, per nephrology --encourage oral hydration  Foley present --unclear reason, presumed due to urinary retention. --consider removal and voiding trial in a few days  Underweight, BMI 18.5   DVT prophylaxis: Heparin SQ Code Status: DNR  Family Communication:  Status is: inpatient Dispo:   The patient is from: home Anticipated d/c is to: undetermined Anticipated d/c date is: > 3 days Patient currently is not medically ready to d/c due to: unresponsive, encephalopathic   Subjective and Interval History:  Pt was more alert today, and said yes to water.      Objective: Vitals:   07/27/20 0344 07/27/20 0723 07/27/20 1128 07/27/20 1525  BP: (!) 156/88 (!) 145/87 (!) 146/95 129/89  Pulse: 97 95 94 85  Resp: '20 16 20 20  '$ Temp: 98.3 F (36.8 C) 97.7 F (36.5 C) 97.8 F (36.6 C) (!) 97.5 F (36.4 C)  TempSrc: Oral Oral  Oral  SpO2: 100% 99% 100% 97%  Weight:      Height:        Intake/Output Summary (Last 24 hours) at 07/27/2020 1744 Last data filed at 07/27/2020 1015 Gross per 24 hour  Intake --  Output 800 ml  Net -800 ml   Filed Weights   07/08/2020 1056  07/25/20 0105  Weight: 72.6 kg 55.3 kg    Examination:   Constitutional: NAD, more alert today, not oriented HEENT: conjunctivae and lids normal, EOMI CV: No cyanosis.   RESP: normal respiratory effort, on RA Extremities: No effusions, edema in BLE SKIN: warm, dry Foley present   Data Reviewed: I have personally reviewed following labs and imaging studies  CBC: Recent Labs  Lab 07/06/2020 1058 07/23/20 0407 07/24/20 0500 07/25/20 0527 07/26/20 0804 07/27/20 0731  WBC 18.7* 15.9* 18.1* 16.6* 18.7* 19.8*  NEUTROABS 16.2*  --   --   --   --   --   HGB 13.4 10.3* 10.9* 12.2* 11.1* 12.0*  HCT 40.8 31.0* 32.4* 37.0* 34.4* 36.4*  MCV 81.9 81.2 82.2 84.1 82.5 83.3  PLT 824* 583* 539* 519* 490* 123XX123*   Basic Metabolic Panel: Recent Labs  Lab 07/17/2020 1058 07/15/2020 1417 07/23/20 0407 07/23/20 1215 07/24/20 0500 07/25/20 0527 07/26/20 0804 07/27/20 0731  NA 146*   < > 146*  --  151* 152* 147* 145  K 7.1*   < > 4.3 4.2 3.8 3.9 3.3* 3.7  CL 109   < > 102  --  105 107 106 106  CO2 18*   < > 27  --  '25 25 25 24  '$ GLUCOSE 324*   < > 123*  --  134* 207* 345* 322*  BUN 167*   < > 108*  --  90* 75* 70* 72*  CREATININE 6.83*   < > 4.33*  --  3.10* 2.81* 2.53* 2.45*  CALCIUM 10.3   < > 9.0  --  9.2 9.4 8.7* 8.6*  MG 3.3*  --   --   --  2.1 2.0 1.9 1.9   < > = values in this interval not displayed.   GFR: Estimated Creatinine Clearance: 16.9 mL/min (A) (by C-G formula based on SCr of 2.45 mg/dL (H)). Liver Function Tests: Recent Labs  Lab 07/25/2020 1058  AST 40  ALT 38  ALKPHOS 97  BILITOT 1.3*  PROT 9.0*  ALBUMIN 3.6   No results for input(s): LIPASE, AMYLASE in the last 168 hours. No results for input(s): AMMONIA in the last 168 hours. Coagulation Profile: Recent Labs  Lab 07/23/20 0407  INR 1.1   Cardiac Enzymes: No results for input(s): CKTOTAL, CKMB, CKMBINDEX, TROPONINI in the last 168 hours. BNP (last 3 results) No results for input(s): PROBNP in the last  8760 hours. HbA1C: No results for input(s): HGBA1C in the last 72 hours. CBG: Recent Labs  Lab 07/26/20 1202 07/26/20 1644 07/27/20 0839 07/27/20 1125 07/27/20 1524  GLUCAP 206* 140* 295* 326* 191*   Lipid Profile: No results for input(s): CHOL, HDL, LDLCALC, TRIG, CHOLHDL, LDLDIRECT in the last 72 hours. Thyroid Function Tests: No results for input(s): TSH, T4TOTAL, FREET4, T3FREE, THYROIDAB in the last 72 hours. Anemia Panel: No results for input(s): VITAMINB12, FOLATE, FERRITIN, TIBC, IRON, RETICCTPCT in the last 72 hours. Sepsis Labs: Recent Labs  Lab 07/08/2020 1058 07/14/2020 1748 07/23/20 0407  PROCALCITON  --   --  1.95  LATICACIDVEN 2.1* 1.9  --  Recent Results (from the past 240 hour(s))  SARS Coronavirus 2 by RT PCR (hospital order, performed in [facility] hospital lab) Nasopharyngeal Nasopharyngeal Swab     Status: Abnormal   Collection Time: 07/03/2020  1:15 PM   Specimen: Nasopharyngeal Swab  Result Value Ref Range Status   SARS Coronavirus 2 POSITIVE (A) NEGATIVE Final    Comment: RESULT CALLED TO, READ BACK BY AND VERIFIED WITH: ROBIN REGISTER AT 1453 ON 07/31/2020 BY SS (NOTE) SARS-CoV-2 target nucleic acids are DETECTED  SARS-CoV-2 RNA is generally detectable in upper respiratory specimens  during the acute phase of infection.  Positive results are indicative  of the presence of the identified virus, but do not rule out bacterial infection or co-infection with other pathogens not detected by the test.  Clinical correlation with patient history and  other diagnostic information is necessary to determine patient infection status.  The expected result is negative.  Fact Sheet for Patients:   StrictlyIdeas.no   Fact Sheet for Healthcare Providers:   BankingDealers.co.za    This test is not yet approved or cleared by the [place] FDA and  has been authorized for detection and/or diagnosis of SARS-CoV-2  by FDA under an Emergency Use Authorization (EUA).  This EUA will remain in effect (meaning th is test can be used) for the duration of  the COVID-19 declaration under Section 564(b)(1) of the Act, 21 U.S.C. section 360-bbb-3(b)(1), unless the authorization is terminated or revoked sooner.  Performed at [facility], [address] [place], [place] [address]   Culture, blood (single)     Status: None   Collection Time: 07/18/2020  2:17 PM   Specimen: BLOOD  Result Value Ref Range Status   Specimen Description BLOOD RIGHT ANTECUBITAL  Final   Special Requests   Final    BOTTLES DRAWN AEROBIC AND ANAEROBIC Blood Culture adequate volume   Culture   Final    NO GROWTH 5 DAYS Performed at [facility], [address] [place], [place] [address]    Report Status 07/27/2020 FINAL  Final  Urine Culture     Status: Abnormal   Collection Time: 07/21/2020  2:17 PM   Specimen: Urine, Random  Result Value Ref Range Status   Specimen Description   Final    URINE, RANDOM Performed at [facility], [address], [place], [place] [address]    Special Requests   Final    NONE Performed at [facility], [address] [place], [place] [address]    Culture >=100,000 COLONIES/mL ESCHERICHIA COLI (A)  Final   Report Status 07/25/2020 FINAL  Final   Organism ID, Bacteria ESCHERICHIA COLI (A)  Final      Susceptibility   Escherichia coli - MIC*    AMPICILLIN <=2 SENSITIVE Sensitive     CEFAZOLIN <=4 SENSITIVE Sensitive     CEFEPIME <=0.12 SENSITIVE Sensitive     CEFTRIAXONE <=0.25 SENSITIVE Sensitive     CIPROFLOXACIN <=0.25 SENSITIVE Sensitive     GENTAMICIN <=1 SENSITIVE Sensitive     IMIPENEM <=0.25 SENSITIVE Sensitive     NITROFURANTOIN <=16 SENSITIVE Sensitive     TRIMETH/SULFA <=20 SENSITIVE Sensitive     AMPICILLIN/SULBACTAM <=2 SENSITIVE Sensitive     PIP/TAZO <=4 SENSITIVE Sensitive     * >=100,000 COLONIES/mL ESCHERICHIA COLI       Radiology Studies: No results found.   Scheduled Meds: . cephALEXin  250 mg Oral Q12H  . Chlorhexidine Gluconate Cloth  6 each Topical  Q0600  . heparin  5,000 Units Subcutaneous Q8H  . insulin aspart  0-20 Units Subcutaneous TID WC  . insulin glargine  10 Units Subcutaneous Daily  . linagliptin  5 mg Oral Daily  . methylPREDNISolone (SOLU-MEDROL) injection  40 mg Intravenous Q8H   Continuous Infusions: . dextrose 50 mL/hr at 07/26/20 1655     LOS: 5 days     Shawn Bi, MD [facility] If 7PM-7AM, please contact night-coverage 07/27/2020, 5:44 PM

## 2020-07-27 NOTE — Progress Notes (Addendum)
Inpatient Diabetes Program Recommendations  AACE/ADA: New Consensus Statement on Inpatient Glycemic Control (2015)  Target Ranges:  Prepandial:   less than 140 mg/dL      Peak postprandial:   less than 180 mg/dL (1-2 hours)      Critically ill patients:  140 - 180 mg/dL   Lab Results  Component Value Date   GLUCAP 295 (H) 07/27/2020   HGBA1C 8.4 (H) 07/12/2020    Review of Glycemic Control Results for ABDI, ROLLE (MRN [id number]) as of 07/27/2020 09:21  Ref. Range 07/26/2020 12:02 07/26/2020 16:44 07/27/2020 08:39  Glucose-Capillary Latest Ref Range: 70 - 99 mg/dL 206 (H) 140 (H) 295 (H)  Diabetes history: DM2 Outpatient Diabetes medications: Glipizide 10 mg BID, Tradjenta 5 mg daily Current orders for Inpatient glycemic control: Novlog 0-20 units TIDw ith meals; Solumedrol 40 mg Q8H, Tradjenta 5 mg daily  Inpatient Diabetes Program Recommendations:    Insulin: If steroids are continued, please consider ordering Lantus 10 units Q24H.  Thanks,  Adah Perl, RN, BC-ADM Inpatient Diabetes Coordinator Pager [phone] (8a-5p)

## 2020-07-28 DIAGNOSIS — N170 Acute kidney failure with tubular necrosis: Secondary | ICD-10-CM | POA: Diagnosis not present

## 2020-07-28 DIAGNOSIS — N179 Acute kidney failure, unspecified: Secondary | ICD-10-CM | POA: Diagnosis not present

## 2020-07-28 DIAGNOSIS — Z7189 Other specified counseling: Secondary | ICD-10-CM

## 2020-07-28 DIAGNOSIS — E875 Hyperkalemia: Secondary | ICD-10-CM | POA: Diagnosis not present

## 2020-07-28 DIAGNOSIS — A419 Sepsis, unspecified organism: Secondary | ICD-10-CM | POA: Diagnosis not present

## 2020-07-28 DIAGNOSIS — R6520 Severe sepsis without septic shock: Secondary | ICD-10-CM | POA: Diagnosis not present

## 2020-07-28 DIAGNOSIS — F0391 Unspecified dementia with behavioral disturbance: Secondary | ICD-10-CM | POA: Diagnosis not present

## 2020-07-28 LAB — BASIC METABOLIC PANEL
Anion gap: 15 (ref 5–15)
BUN: 75 mg/dL — ABNORMAL HIGH (ref 8–23)
CO2: 22 mmol/L (ref 22–32)
Calcium: 8.7 mg/dL — ABNORMAL LOW (ref 8.9–10.3)
Chloride: 104 mmol/L (ref 98–111)
Creatinine, Ser: 2.35 mg/dL — ABNORMAL HIGH (ref 0.61–1.24)
GFR, Estimated: 26 mL/min — ABNORMAL LOW (ref >60)
Glucose, Bld: 376 mg/dL — ABNORMAL HIGH (ref 70–99)
Potassium: 3.7 mmol/L (ref 3.5–5.1)
Sodium: 141 mmol/L (ref 135–145)

## 2020-07-28 LAB — CBC
HCT: 36.1 % — ABNORMAL LOW (ref 39.0–52.0)
Hemoglobin: 11.7 g/dL — ABNORMAL LOW (ref 13.0–17.0)
MCH: 27.1 pg (ref 26.0–34.0)
MCHC: 32.4 g/dL (ref 30.0–36.0)
MCV: 83.8 fL (ref 80.0–100.0)
Platelets: 475 10*3/uL — ABNORMAL HIGH (ref 150–400)
RBC: 4.31 MIL/uL (ref 4.22–5.81)
RDW: 13.9 % (ref 11.5–15.5)
WBC: 18 10*3/uL — ABNORMAL HIGH (ref 4.0–10.5)
nRBC: 0 % (ref 0.0–0.2)

## 2020-07-28 LAB — GLUCOSE, CAPILLARY
Glucose-Capillary: 308 mg/dL — ABNORMAL HIGH (ref 70–99)
Glucose-Capillary: 351 mg/dL — ABNORMAL HIGH (ref 70–99)
Glucose-Capillary: 157 mg/dL — ABNORMAL HIGH (ref 70–99)
Glucose-Capillary: 132 mg/dL — ABNORMAL HIGH (ref 70–99)
Glucose-Capillary: 169 mg/dL — ABNORMAL HIGH (ref 70–99)

## 2020-07-28 LAB — C-REACTIVE PROTEIN: CRP: 6.2 mg/dL — ABNORMAL HIGH (ref <1.0)

## 2020-07-28 LAB — MAGNESIUM: Magnesium: 2.1 mg/dL (ref 1.7–2.4)

## 2020-07-28 MED ORDER — CHLORHEXIDINE GLUCONATE CLOTH 2 % EX PADS
6.0000 | MEDICATED_PAD | Freq: Every day | CUTANEOUS | Status: DC
  Administered 2020-07-28 – 2020-07-31 (×4): 6 via TOPICAL

## 2020-07-28 NOTE — Consult Note (Addendum)
Consultation Note Date: 07/28/2020   Patient Name: Shawn May  DOB: [birth year]  MRN: [id number]  Age / Sex: 87 y.o., male  PCP: [facility] [place] Referring Physician: Cherene Altes, MD  Reason for Consultation: Establishing goals of care  HPI/Patient Profile: Shawn May is a 87 y.o. male with medical history significant for dementia.  Alzheimer's versus vascular, history of CVA, diabetes mellitus with complications of stage III chronic kidney disease, hypertension and dyslipidemia who was brought into the ER by EMS for evaluation of shortness of breath and weakness.  Clinical Assessment and Goals of Care: Patient is on covid isolation. Per nursing he is eating 100% and appears to be improving.   Attempted to call patient's wife. Unable to reach her on cell phone. Attempted to reach her on home phone. Grandson answered the phone. He states she is not home, and he is there to help his grandparents. He shares with me that for the past 2 years his grandfather has stopped talking, and only mumbles. He states he is incontinent and uses diapers. He was ambulatory until just prior to admission when he had SOB, and spent much of the day and night pacing. He states he has had to install alarms on the doors and hide car keys. He states his grandfather needs assistance with all ADLs. Discussed that I would call another time to try to reach his grandmother.      SUMMARY OF RECOMMENDATIONS    Will reattempt reaching patient's wife at another time.   Prognosis:   Poor overall       Primary Diagnoses: Present on Admission: . AKI (acute kidney injury) ([place]) . Dementia ([place]) . Type 2 diabetes mellitus with stage 3 chronic kidney disease ([place]) . Sepsis ([place]) . Acute lower UTI   I have reviewed the medical record, interviewed the patient and family, and examined the patient. The  following aspects are pertinent.  Past Medical History:  Diagnosis Date  . Dementia ([place])   . Diabetes mellitus without complication ([place])   . Hyperlipidemia   . Hypertension   . Renal disorder    Social History   Socioeconomic History  . Marital status: Married    Spouse name: Not on file  . Number of children: Not on file  . Years of education: Not on file  . Highest education level: Not on file  Occupational History  . Not on file  Tobacco Use  . Smoking status: Former Research scientist (life sciences)  . Smokeless tobacco: Never Used  Substance and Sexual Activity  . Alcohol use: Not Currently  . Drug use: Not Currently  . Sexual activity: Not on file  Other Topics Concern  . Not on file  Social History Narrative  . Not on file   Social Determinants of Health   Financial Resource Strain: Not on file  Food Insecurity: Not on file  Transportation Needs: Not on file  Physical Activity: Not on file  Stress: Not on file  Social Connections: Not on file  History reviewed. No pertinent family history. Scheduled Meds: . cephALEXin  250 mg Oral Q12H  . Chlorhexidine Gluconate Cloth  6 each Topical Daily  . heparin  5,000 Units Subcutaneous Q8H  . insulin aspart  0-20 Units Subcutaneous TID WC  . insulin glargine  10 Units Subcutaneous Daily  . linagliptin  5 mg Oral Daily   Continuous Infusions: . dextrose 50 mL/hr at 07/28/20 0503   PRN Meds:.acetaminophen **OR** acetaminophen, haloperidol lactate, hydrALAZINE, ondansetron **OR** ondansetron (ZOFRAN) IV Medications Prior to Admission:  Prior to Admission medications   Medication Sig Start Date End Date Taking? Authorizing Provider  aspirin 81 MG chewable tablet Chew 1 tablet (81 mg total) by mouth daily. 09/13/18  Yes Demetrios Loll, MD  atorvastatin (LIPITOR) 40 MG tablet Take 40 mg by mouth daily.   Yes [provider]  cholecalciferol (VITAMIN D3) 25 MCG (1000 UT) tablet Take 2,000 Units by mouth daily.   Yes [provider]  donepezil (ARICEPT) 5 MG tablet Take 5 mg by mouth 2 (two) times daily.   Yes [provider]  glipiZIDE (GLUCOTROL XL) 10 MG 24 hr tablet Take 10 mg by mouth 2 (two) times daily.   Yes [provider]  hydrochlorothiazide (HYDRODIURIL) 25 MG tablet Take 25 mg by mouth daily.   Yes [provider]  linagliptin (TRADJENTA) 5 MG TABS tablet Take 5 mg by mouth daily.   Yes [provider]  memantine (NAMENDA) 5 MG tablet Take 5 mg by mouth 2 (two) times daily. 01/20/20  Yes [provider]  quinapril (ACCUPRIL) 10 MG tablet Take 10 mg by mouth daily.   Yes [provider]  sodium bicarbonate 650 MG tablet Take by mouth. Patient not taking: No sig reported 12/29/19 12/28/20  [provider]   No Known Allergies Review of Systems  Unable to perform ROS     Vital Signs: BP 109/70 (BP Location: Right Arm)   Pulse 100   Temp 97.8 F (36.6 C)   Resp 20   Ht '5\' 8"'$  (1.727 m)   Wt 55.3 kg   SpO2 99%   BMI 18.55 kg/m  Pain Scale: 0-10   Pain Score: 0-No pain   SpO2: SpO2: 99 % O2 Device:SpO2: 99 % O2 Flow Rate: .O2 Flow Rate (L/min): 2 L/min  IO: Intake/output summary:   Intake/Output Summary (Last 24 hours) at 07/28/2020 1427 Last data filed at 07/28/2020 1300 Gross per 24 hour  Intake --  Output 1726 ml  Net -1726 ml    LBM: Last BM Date:  (unknown) Baseline Weight: Weight: 72.6 kg Most recent weight: Weight: 55.3 kg      Time In: 2:00 Time Out: 2:30 Time Total: 30 min Greater than 50%  of this time was spent counseling and coordinating care related to the above assessment and plan.  COVID-19 DISASTER DECLARATION:    FULL CONTACT PHYSICAL EXAMINATION WAS NOT POSSIBLE DUE TO TREATMENT OF COVID-19  AND CONSERVATION OF PERSONAL PROTECTIVE EQUIPMENT   Patient assessed or the symptoms described in the history of present illness and daily progress notes.  In the context of the Global COVID-19 pandemic, which  necessitated consideration that the patient might be at risk for infection with the SARS-CoV-2 virus that causes COVID-19, Institutional protocols and algorithms that pertain to the evaluation of patients at risk for COVID-19 are in a state of rapid change based on information released by regulatory bodies including the CDC and federal and state organizations. These policies  and algorithms were followed during the patient's care while in hospital. Signed by: Asencion Gowda, NP   Please contact Palliative Medicine Team phone at [phone] for questions and concerns.  For individual provider: See Shea Evans

## 2020-07-28 NOTE — Progress Notes (Signed)
Shawn May  [id number] DOB: Aug 02, 1931 DOA: 08/01/2020 PCP: [facility] [place]    Brief Narrative:  87 year old with a history of dementia, CVA, DM 2, CKD stage IV, HTN, and HLD who was brought to the ER by EMS with shortness of breath and severe generalized weakness. His family reported very poor oral intake for 2 to 3 days with increasing weakness. EMS found the patient with saturations in the 80s on room air.  Significant Events:  1/20 admit via ED 1/24 dialysis catheter removed  Date of Positive COVID Test:  1/20  Vaccination Status: Unknown  COVID-19 specific Treatment: Solu-Medrol 1/23 > 1/25  Antimicrobials:  Ceftriaxone 1/20 > 1/23 Keflex 1/24 >  DVT prophylaxis: Subcutaneous heparin  Subjective: Resting comfortably in bed.  No evidence of respiratory distress or uncontrolled pain.  Will only barely open his eyes for me.  Does not follow simple commands.  Wife at bedside states he was much more interactive earlier and she feels he is "very tired from eating and working with therapy."  No evidence of acute distress.  Assessment & Plan:  Severe sepsis POA - pansensitive E. coli UTI Antibiotic course continues -no indication of persisting infection  Acute kidney failure on CKD stage IV Baseline creatinine 2.46 -creatinine 6.83 at time of admission -followed by nephrology at [facility] -required urgent dialysis for significant azotemia and hyperkalemia -no further need for dialysis at present -nephrology has followed  Hyperkalemia corrected with dialysis  Covid infection CXR clear -in absence of hypoxia there is no indication for steroid -monitor clinically but appears this is essentially asymptomatic  Recent Labs  Lab 07/15/2020 1058 07/23/20 0407 07/24/20 0500 07/25/20 0527 07/26/20 0804 07/27/20 0731  CRP  --   --  24.4* 21.2* 15.2* 9.1*  ALT 38  --   --   --   --   --   PROCALCITON  --  1.95  --   --   --   --     DM2 with chronic kidney  disease A1c 8.4 -CBG quite variable -follow without change for now with discontinuation of steroid  Acute metabolic encephalopathy with baseline dementia Mental status waxing and waning severely -discontinued steroid -follow -clear he will need SNF placement  Dysphagia  continued D1 diet with nectar thick liquids per SLP  Hypernatremia due to free water deficit -resolved  Acute urinary retention -Foley catheter present  Underweight - Body mass index is 18.55 kg/m.   Code Status: NO CODE BLUE Family Communication: Spoke with patient's wife at bedside Status is: Inpatient  Remains inpatient appropriate because:Unsafe d/c plan   Dispo: The patient is from: Home              Anticipated d/c is to: SNF              Anticipated d/c date is: 2 days              Patient currently is not medically stable to d/c.   Difficult to place patient No   Consultants:  Nephrology  Objective: Blood pressure (!) 150/98, pulse 84, temperature 97.8 F (36.6 C), resp. rate 16, height '5\' 8"'$  (1.727 m), weight 55.3 kg, SpO2 100 %.  Intake/Output Summary (Last 24 hours) at 07/28/2020 0943 Last data filed at 07/28/2020 0300 Gross per 24 hour  Intake -  Output 825 ml  Net -825 ml   Filed Weights   07/07/2020 1056 07/25/20 0105  Weight: 72.6 kg 55.3 kg  Examination: General: No acute respiratory distress Lungs: Clear to auscultation bilaterally without wheezes or crackles Cardiovascular: Regular rate and rhythm without murmur gallop or rub normal S1 and S2 Abdomen: Nontender, nondistended, soft, bowel sounds positive, no rebound, no ascites, no appreciable mass Extremities: No significant cyanosis, clubbing, or edema bilateral lower extremities  CBC: Recent Labs  Lab 07/26/2020 1058 07/23/20 0407 07/26/20 0804 07/27/20 0731 07/28/20 0554  WBC 18.7*   < > 18.7* 19.8* 18.0*  NEUTROABS 16.2*  --   --   --   --   HGB 13.4   < > 11.1* 12.0* 11.7*  HCT 40.8   < > 34.4* 36.4* 36.1*  MCV  81.9   < > 82.5 83.3 83.8  PLT 824*   < > 490* 465* 475*   < > = values in this interval not displayed.   Basic Metabolic Panel: Recent Labs  Lab 07/26/20 0804 07/27/20 0731 07/28/20 0554  NA 147* 145 141  K 3.3* 3.7 3.7  CL 106 106 104  CO2 '25 24 22  '$ GLUCOSE 345* 322* 376*  BUN 70* 72* 75*  CREATININE 2.53* 2.45* 2.35*  CALCIUM 8.7* 8.6* 8.7*  MG 1.9 1.9 2.1   GFR: Estimated Creatinine Clearance: 17.6 mL/min (A) (by C-G formula based on SCr of 2.35 mg/dL (H)).  Liver Function Tests: Recent Labs  Lab 07/31/2020 1058  AST 40  ALT 38  ALKPHOS 97  BILITOT 1.3*  PROT 9.0*  ALBUMIN 3.6    Coagulation Profile: Recent Labs  Lab 07/23/20 0407  INR 1.1    HbA1C: Hgb A1c MFr Bld  Date/Time Value Ref Range Status  08/01/2020 03:26 PM 8.4 (H) 4.8 - 5.6 % Final    Comment:    (NOTE) Pre diabetes:          5.7%-6.4%  Diabetes:              >6.4%  Glycemic control for   <7.0% adults with diabetes   09/12/2018 05:40 AM 11.1 (H) 4.8 - 5.6 % Final    Comment:    (NOTE) Pre diabetes:          5.7%-6.4% Diabetes:              >6.4% Glycemic control for   <7.0% adults with diabetes     CBG: Recent Labs  Lab 07/27/20 0839 07/27/20 1125 07/27/20 1524 07/27/20 2140 07/28/20 0734  GLUCAP 295* 326* 191* 300* 308*    Recent Results (from the past 240 hour(s))  SARS Coronavirus 2 by RT PCR (hospital order, performed in [facility] hospital lab) Nasopharyngeal Nasopharyngeal Swab     Status: Abnormal   Collection Time: 07/24/2020  1:15 PM   Specimen: Nasopharyngeal Swab  Result Value Ref Range Status   SARS Coronavirus 2 POSITIVE (A) NEGATIVE Final    Comment: RESULT CALLED TO, READ BACK BY AND VERIFIED WITH: ROBIN REGISTER AT 1453 ON 07/24/2020 BY SS (NOTE) SARS-CoV-2 target nucleic acids are DETECTED  SARS-CoV-2 RNA is generally detectable in upper respiratory specimens  during the acute phase of infection.  Positive results are indicative  of the presence of  the identified virus, but do not rule out bacterial infection or co-infection with other pathogens not detected by the test.  Clinical correlation with patient history and  other diagnostic information is necessary to determine patient infection status.  The expected result is negative.  Fact Sheet for Patients:   StrictlyIdeas.no   Fact Sheet for Healthcare Providers:   BankingDealers.co.za  This test is not yet approved or cleared by the [place] and  has been authorized for detection and/or diagnosis of SARS-CoV-2 by FDA under an Emergency Use Authorization (EUA).  This EUA will remain in effect (meaning th is test can be used) for the duration of  the COVID-19 declaration under Section 564(b)(1) of the Act, 21 U.S.C. section 360-bbb-3(b)(1), unless the authorization is terminated or revoked sooner.  Performed at [facility], [address] [place], [place] [address]   Culture, blood (single)     Status: None   Collection Time: 07/25/2020  2:17 PM   Specimen: BLOOD  Result Value Ref Range Status   Specimen Description BLOOD RIGHT ANTECUBITAL  Final   Special Requests   Final    BOTTLES DRAWN AEROBIC AND ANAEROBIC Blood Culture adequate volume   Culture   Final    NO GROWTH 5 DAYS Performed at [facility], [address] [place], [place] [address]    Report Status 07/27/2020 FINAL  Final  Urine Culture     Status: Abnormal   Collection Time: 07/16/2020  2:17 PM   Specimen: Urine, Random  Result Value Ref Range Status   Specimen Description   Final    URINE, RANDOM Performed at [facility], [address] [place], [place] [address]    Special Requests   Final    NONE Performed at [facility], [place], [place] [address]    Culture >=100,000 COLONIES/mL ESCHERICHIA COLI (A)  Final   Report Status 07/25/2020 FINAL  Final   Organism ID, Bacteria ESCHERICHIA  COLI (A)  Final      Susceptibility   Escherichia coli - MIC*    AMPICILLIN <=2 SENSITIVE Sensitive     CEFAZOLIN <=4 SENSITIVE Sensitive     CEFEPIME <=0.12 SENSITIVE Sensitive     CEFTRIAXONE <=0.25 SENSITIVE Sensitive     CIPROFLOXACIN <=0.25 SENSITIVE Sensitive     GENTAMICIN <=1 SENSITIVE Sensitive     IMIPENEM <=0.25 SENSITIVE Sensitive     NITROFURANTOIN <=16 SENSITIVE Sensitive     TRIMETH/SULFA <=20 SENSITIVE Sensitive     AMPICILLIN/SULBACTAM <=2 SENSITIVE Sensitive     PIP/TAZO <=4 SENSITIVE Sensitive     * >=100,000 COLONIES/mL ESCHERICHIA COLI     Scheduled Meds: . cephALEXin  250 mg Oral Q12H  . Chlorhexidine Gluconate Cloth  6 each Topical Daily  . heparin  5,000 Units Subcutaneous Q8H  . insulin aspart  0-20 Units Subcutaneous TID WC  . insulin glargine  10 Units Subcutaneous Daily  . linagliptin  5 mg Oral Daily  . methylPREDNISolone (SOLU-MEDROL) injection  40 mg Intravenous Q8H   Continuous Infusions: . dextrose 50 mL/hr at 07/28/20 0503     LOS: 6 days   Cherene Altes, MD [facility] Office  [phone] Pager - Text Page per Shea Evans  If 7PM-7AM, please contact night-coverage per Amion 07/28/2020, 9:43 AM

## 2020-07-28 NOTE — Evaluation (Signed)
Physical Therapy Evaluation Patient Details Name: Shawn May MRN: [id number] DOB: 17-Jan-1932 Today's Date: 07/28/2020   History of Present Illness  Shawn May is an 59yoM who [facility] c SOB, weakness, K+: 7.1. Pt positive COVID19. PMH: dementia, CVA, DM, CKD3, HTN. Per wife, ad baseline pt fully independent with intermittnent AMS due to dementia, otherwise mobile, no falls history.  Clinical Impression  Pt admitted with above diagnosis. Pt currently with functional limitations due to the deficits listed below (see "PT Problem List"). Upon entry, pt in bed, lethargic as he remains, and minimally interactive at times, but follows basic commands <25% of the time. Wife at bedside throughout. The pt is easy to awaken, but maintains eyes closed throughout. ModA or more for all basic mobility this date. Pt unable to attempt AMB due to somnolence and poor standing tolerance. Patient's performance this date reveals decreased ability, independence, and tolerance in performing all basic mobility required for performance of activities of daily living. Pt requires additional DME, close physical assistance, and cues for safe participate in mobility. Pt will benefit from skilled PT intervention to increase independence and safety with basic mobility in preparation for discharge to the venue listed below.       Follow Up Recommendations SNF;Supervision for mobility/OOB;Supervision - Intermittent    Equipment Recommendations  None recommended by PT    Recommendations for Other Services       Precautions / Restrictions Precautions Precautions: Fall      Mobility  Bed Mobility Overal bed mobility: Needs Assistance Bed Mobility: Supine to Sit;Sit to Supine     Supine to sit: Max assist Sit to supine: Mod assist        Transfers Overall transfer level: Needs assistance Equipment used: 1 person hand held assist Transfers: Sit to/from Stand Sit to Stand: Mod assist          General transfer comment: unable to fully rise, remains mildly crouched. hand over hand for support, pt impulsively trying to sit.  Ambulation/Gait Ambulation/Gait assistance:  (unsafe to attempt.)              Stairs            Wheelchair Mobility    Modified Rankin (Stroke Patients Only)       Balance                                             Pertinent Vitals/Pain      Home Living Family/patient expects to be discharged to:: Private residence Living Arrangements: Spouse/significant other Early Chars (who is on HD)) Available Help at Discharge: Family Type of Home: Mobile home Home Access: Stairs to enter Entrance Stairs-Rails: Left;Right;Can reach both Entrance Stairs-Number of Steps: 5 Home Layout: One level Home Equipment: None;Bedside commode      Prior Function Level of Independence: Independent         Comments: Wife drives, wife took liscence away due to dementia, no falls issues or baalnce problems at baseline. indepndent with ADL.     Hand Dominance        Extremity/Trunk Assessment                Communication      Cognition Arousal/Alertness: Lethargic Behavior During Therapy: Flat affect;Impulsive Overall Cognitive Status: Impaired/Different from baseline  General Comments: somnolent, not following simple commands, not very interactive verbally, difficulty to understand.      General Comments      Exercises     Assessment/Plan    PT Assessment Patient needs continued PT services  PT Problem List Decreased strength;Decreased range of motion;Decreased activity tolerance;Decreased balance;Decreased mobility;Decreased knowledge of use of DME;Decreased safety awareness;Decreased knowledge of precautions       PT Treatment Interventions DME instruction;Balance training;Gait training;Functional mobility training;Therapeutic activities;Therapeutic  exercise;Patient/family education    PT Goals (Current goals can be found in the Care Plan section)  Acute Rehab PT Goals Patient Stated Goal: regain ambulatory status, not need assist with basic mobility PT Goal Formulation: With family Time For Goal Achievement: 08/11/20 Potential to Achieve Goals: Fair    Frequency Min 2X/week   Barriers to discharge Decreased caregiver support wife cannot provide extensive physical assist    Co-evaluation               AM-PAC PT "6 Clicks" Mobility  Outcome Measure Help needed turning from your back to your side while in a flat bed without using bedrails?: A Lot Help needed moving from lying on your back to sitting on the side of a flat bed without using bedrails?: A Lot Help needed moving to and from a bed to a chair (including a wheelchair)?: A Lot Help needed standing up from a chair using your arms (e.g., wheelchair or bedside chair)?: A Lot Help needed to walk in hospital room?: Total Help needed climbing 3-5 steps with a railing? : Total 6 Click Score: 10    End of Session   Activity Tolerance: No increased pain;Patient limited by lethargy;Patient limited by fatigue Patient left: in bed;with nursing/sitter in room;with call bell/phone within reach;with family/visitor present Nurse Communication: Mobility status PT Visit Diagnosis: Difficulty in walking, not elsewhere classified (R26.2)    Time: [id number] PT Time Calculation (min) (ACUTE ONLY): 25 min   Charges:   PT Evaluation $PT Eval Moderate Complexity: 1 Mod          4:12 PM, 07/28/20 Etta Grandchild, PT, DPT Physical Therapist - [facility]  [phone] ([place])    [place] C 07/28/2020, 4:10 PM

## 2020-07-28 NOTE — Progress Notes (Signed)
OT Cancellation Note  Patient Details Name: Shawn May MRN: [id number] DOB: 1932/04/19   Cancelled Treatment:    Reason Eval/Treat Not Completed: Fatigue/lethargy limiting ability to participate  Pt difficult to rouse. He does finally wake when RN enters room despite OT sternal rubbing him an him only grimmacing just prior. Once Pt rouses, pt with limited visual attention and responses to OT. Pt does ultimately decline session. RN checks pt's BG and OT exits room at this time. Will f/u for OT evaluation at later date/time as able/approriate.   Gerrianne Scale, [place], OTR/L ascom [phone] 07/28/20, 5:36 PM

## 2020-07-28 NOTE — Progress Notes (Signed)
[facility]  ROUNDING NOTE   Subjective:   Off IV fluids   Creatinine 2.35 (2.45) (2.53) (2.81) (3.1) Na 141 (145) (147) (152) (151)  UOP 825   Objective:  Vital signs in last 24 hours:  Temp:  [97.3 F (36.3 C)-97.8 F (36.6 C)] 97.8 F (36.6 C) (01/26 1215) Pulse Rate:  [84-100] 100 (01/26 1215) Resp:  [16-20] 20 (01/26 1215) BP: (109-169)/(70-98) 109/70 (01/26 1215) SpO2:  [97 %-100 %] 99 % (01/26 1215)  Weight change:  Filed Weights   08/01/2020 1056 07/25/20 0105  Weight: 72.6 kg 55.3 kg    Intake/Output: I/O last 3 completed shifts: In: -  Out: 1325 [Urine:1325]   Intake/Output this shift:  Total I/O In: -  Out: 1201 [Urine:1201]  Physical Exam: General: NAD, laying in bed  Head: Normocephalic, atraumatic. Dry oral mucosal membranes  Eyes: Anicteric, PERRL  Neck: Supple, trachea midline  Lungs:  Clear to auscultation  Heart: Regular rate and rhythm  Abdomen:  Soft, nontender,   Extremities:  no peripheral edema.  Neurologic: confused  Skin: No lesions   Access Right femoral temp HD catheter 1/20 Dr. Lucky Cowboy    Basic Metabolic Panel: Recent Labs  Lab 07/24/20 0500 07/25/20 0527 07/26/20 0804 07/27/20 0731 07/28/20 0554  NA 151* 152* 147* 145 141  K 3.8 3.9 3.3* 3.7 3.7  CL 105 107 106 106 104  CO2 '25 25 25 24 22  '$ GLUCOSE 134* 207* 345* 322* 376*  BUN 90* 75* 70* 72* 75*  CREATININE 3.10* 2.81* 2.53* 2.45* 2.35*  CALCIUM 9.2 9.4 8.7* 8.6* 8.7*  MG 2.1 2.0 1.9 1.9 2.1    Liver Function Tests: Recent Labs  Lab 07/30/2020 1058  AST 40  ALT 38  ALKPHOS 97  BILITOT 1.3*  PROT 9.0*  ALBUMIN 3.6   No results for input(s): LIPASE, AMYLASE in the last 168 hours. No results for input(s): AMMONIA in the last 168 hours.  CBC: Recent Labs  Lab 07/04/2020 1058 07/23/20 0407 07/24/20 0500 07/25/20 0527 07/26/20 0804 07/27/20 0731 07/28/20 0554  WBC 18.7*   < > 18.1* 16.6* 18.7* 19.8* 18.0*  NEUTROABS 16.2*  --   --   --   --    --   --   HGB 13.4   < > 10.9* 12.2* 11.1* 12.0* 11.7*  HCT 40.8   < > 32.4* 37.0* 34.4* 36.4* 36.1*  MCV 81.9   < > 82.2 84.1 82.5 83.3 83.8  PLT 824*   < > 539* 519* 490* 465* 475*   < > = values in this interval not displayed.    Cardiac Enzymes: No results for input(s): CKTOTAL, CKMB, CKMBINDEX, TROPONINI in the last 168 hours.  BNP: Invalid input(s): POCBNP  CBG: Recent Labs  Lab 07/27/20 1125 07/27/20 1524 07/27/20 2140 07/28/20 0734 07/28/20 1216  GLUCAP 326* 191* 300* 308* 351*    Microbiology: Results for orders placed or performed during the hospital encounter of 07/12/2020  SARS Coronavirus 2 by RT PCR (hospital order, performed in [facility] hospital lab) Nasopharyngeal Nasopharyngeal Swab     Status: Abnormal   Collection Time: 08/01/2020  1:15 PM   Specimen: Nasopharyngeal Swab  Result Value Ref Range Status   SARS Coronavirus 2 POSITIVE (A) NEGATIVE Final    Comment: RESULT CALLED TO, READ BACK BY AND VERIFIED WITH: ROBIN REGISTER AT 1453 ON 07/05/2020 BY SS (NOTE) SARS-CoV-2 target nucleic acids are DETECTED  SARS-CoV-2 RNA is generally detectable in upper respiratory specimens  during the acute phase of infection.  Positive results are indicative  of the presence of the identified virus, but do not rule out bacterial infection or co-infection with other pathogens not detected by the test.  Clinical correlation with patient history and  other diagnostic information is necessary to determine patient infection status.  The expected result is negative.  Fact Sheet for Patients:   StrictlyIdeas.no   Fact Sheet for Healthcare Providers:   BankingDealers.co.za    This test is not yet approved or cleared by the [place] FDA and  has been authorized for detection and/or diagnosis of SARS-CoV-2 by FDA under an Emergency Use Authorization (EUA).  This EUA will remain in effect (meaning th is test can be used) for  the duration of  the COVID-19 declaration under Section 564(b)(1) of the Act, 21 U.S.C. section 360-bbb-3(b)(1), unless the authorization is terminated or revoked sooner.  Performed at [facility], [address] [place], [place] [address]   Culture, blood (single)     Status: None   Collection Time: 07/14/2020  2:17 PM   Specimen: BLOOD  Result Value Ref Range Status   Specimen Description BLOOD RIGHT ANTECUBITAL  Final   Special Requests   Final    BOTTLES DRAWN AEROBIC AND ANAEROBIC Blood Culture adequate volume   Culture   Final    NO GROWTH 5 DAYS Performed at [facility], [address] [place], [place] [address]    Report Status 07/27/2020 FINAL  Final  Urine Culture     Status: Abnormal   Collection Time: 07/19/2020  2:17 PM   Specimen: Urine, Random  Result Value Ref Range Status   Specimen Description   Final    URINE, RANDOM Performed at [facility], [address], [place], [place] [address]    Special Requests   Final    NONE Performed at [facility], [place],  [address]    Culture >=100,000 COLONIES/mL ESCHERICHIA COLI (A)  Final   Report Status 07/25/2020 FINAL  Final   Organism ID, Bacteria ESCHERICHIA COLI (A)  Final      Susceptibility   Escherichia coli - MIC*    AMPICILLIN <=2 SENSITIVE Sensitive     CEFAZOLIN <=4 SENSITIVE Sensitive     CEFEPIME <=0.12 SENSITIVE Sensitive     CEFTRIAXONE <=0.25 SENSITIVE Sensitive     CIPROFLOXACIN <=0.25 SENSITIVE Sensitive     GENTAMICIN <=1 SENSITIVE Sensitive     IMIPENEM <=0.25 SENSITIVE Sensitive     NITROFURANTOIN <=16 SENSITIVE Sensitive     TRIMETH/SULFA <=20 SENSITIVE Sensitive     AMPICILLIN/SULBACTAM <=2 SENSITIVE Sensitive     PIP/TAZO <=4 SENSITIVE Sensitive     * >=100,000 COLONIES/mL ESCHERICHIA COLI    Coagulation Studies: No results for input(s): LABPROT, INR in the last 72 hours.  Urinalysis: No results for input(s):  COLORURINE, LABSPEC, PHURINE, GLUCOSEU, HGBUR, BILIRUBINUR, KETONESUR, PROTEINUR, UROBILINOGEN, NITRITE, LEUKOCYTESUR in the last 72 hours.  Invalid input(s): APPERANCEUR    Imaging: No results found.   Medications:   . dextrose 50 mL/hr at 07/28/20 0503   . cephALEXin  250 mg Oral Q12H  . Chlorhexidine Gluconate Cloth  6 each Topical Daily  . heparin  5,000 Units Subcutaneous Q8H  . insulin aspart  0-20 Units Subcutaneous TID WC  . insulin glargine  10 Units Subcutaneous Daily  . linagliptin  5 mg Oral Daily   acetaminophen **OR** acetaminophen, haloperidol lactate, hydrALAZINE, ondansetron **OR** ondansetron (ZOFRAN) IV  Assessment/ Plan:  Mr. STATHAM STORMONT is a 87 y.o. black male with dementia, diabetes mellitus type 2, hyperlipidemia, hypertension, who was admitted to [facility] on 07/11/2020 for evaluation of shortness of breath and weakness.  1.  Acute kidney injury on chronic kidney disease stage IV with proteinuria: baseline creatinine of 2.19, GFR of 26 on 12/25/2019.  Followed by [facility] nephrology as an outpatient.   Suspect acute kidney injury now due to severe volume depletion and dehydration.   Emergent hemodialysis on admission.  Chronic kidney disease secondary to diabetic nephropathy - no indication for further dialysis. Nonoliguric urine output.   2.  Hypernatremia: secondary to poor PO intake - Encourage PO intake.     LOS: [place] 1/26/20223:05 PM

## 2020-07-29 DIAGNOSIS — N179 Acute kidney failure, unspecified: Secondary | ICD-10-CM | POA: Diagnosis not present

## 2020-07-29 DIAGNOSIS — F0391 Unspecified dementia with behavioral disturbance: Secondary | ICD-10-CM | POA: Diagnosis not present

## 2020-07-29 DIAGNOSIS — N390 Urinary tract infection, site not specified: Secondary | ICD-10-CM | POA: Diagnosis not present

## 2020-07-29 DIAGNOSIS — A419 Sepsis, unspecified organism: Secondary | ICD-10-CM | POA: Diagnosis not present

## 2020-07-29 LAB — COMPREHENSIVE METABOLIC PANEL
ALT: 36 U/L (ref 0–44)
AST: 29 U/L (ref 15–41)
Albumin: 2.7 g/dL — ABNORMAL LOW (ref 3.5–5.0)
Alkaline Phosphatase: 70 U/L (ref 38–126)
Anion gap: 14 (ref 5–15)
BUN: 88 mg/dL — ABNORMAL HIGH (ref 8–23)
CO2: 21 mmol/L — ABNORMAL LOW (ref 22–32)
Calcium: 8.8 mg/dL — ABNORMAL LOW (ref 8.9–10.3)
Chloride: 110 mmol/L (ref 98–111)
Creatinine, Ser: 2.83 mg/dL — ABNORMAL HIGH (ref 0.61–1.24)
GFR, Estimated: 21 mL/min — ABNORMAL LOW (ref >60)
Glucose, Bld: 293 mg/dL — ABNORMAL HIGH (ref 70–99)
Potassium: 3.1 mmol/L — ABNORMAL LOW (ref 3.5–5.1)
Sodium: 145 mmol/L (ref 135–145)
Total Bilirubin: 0.3 mg/dL (ref 0.3–1.2)
Total Protein: 6.4 g/dL — ABNORMAL LOW (ref 6.5–8.1)

## 2020-07-29 LAB — GLUCOSE, CAPILLARY
Glucose-Capillary: 269 mg/dL — ABNORMAL HIGH (ref 70–99)
Glucose-Capillary: 308 mg/dL — ABNORMAL HIGH (ref 70–99)
Glucose-Capillary: 328 mg/dL — ABNORMAL HIGH (ref 70–99)
Glucose-Capillary: 196 mg/dL — ABNORMAL HIGH (ref 70–99)
Glucose-Capillary: 234 mg/dL — ABNORMAL HIGH (ref 70–99)

## 2020-07-29 LAB — VITAMIN B12: Vitamin B-12: 589 pg/mL (ref 180–914)

## 2020-07-29 LAB — AMMONIA: Ammonia: 17 umol/L (ref 9–35)

## 2020-07-29 LAB — FOLATE: Folate: 3.3 ng/mL — ABNORMAL LOW (ref >5.9)

## 2020-07-29 MED ORDER — DONEPEZIL HCL 5 MG PO TABS
5.0000 mg | ORAL_TABLET | Freq: Two times a day (BID) | ORAL | Status: DC
  Administered 2020-07-29 – 2020-08-01 (×6): 5 mg via ORAL
  Filled 2020-07-29 (×6): qty 1

## 2020-07-29 MED ORDER — INSULIN GLARGINE 100 UNIT/ML ~~LOC~~ SOLN
12.0000 [IU] | Freq: Every day | SUBCUTANEOUS | Status: DC
  Administered 2020-07-29 – 2020-07-30 (×2): 12 [IU] via SUBCUTANEOUS
  Filled 2020-07-29 (×2): qty 0.12

## 2020-07-29 MED ORDER — MEMANTINE HCL 5 MG PO TABS
5.0000 mg | ORAL_TABLET | Freq: Two times a day (BID) | ORAL | Status: DC
  Administered 2020-07-29 – 2020-08-01 (×6): 5 mg via ORAL
  Filled 2020-07-29 (×6): qty 1

## 2020-07-29 NOTE — Progress Notes (Signed)
Shawn May  [id number] DOB: 1932/02/13 DOA: 07/17/2020 PCP: [facility]    Brief Narrative:  [phone] with a history of dementia, CVA, DM 2, CKD stage IV, HTN, and HLD who was brought to the ER by EMS with shortness of breath and severe generalized weakness. His family reported very poor oral intake for 2-3 days with increasing weakness. EMS found the patient with saturations in the 80s on room air.  Significant Events:  1/20 admit via ED 1/24 dialysis catheter removed  Date of Positive COVID Test:  1/20  Vaccination Status: Unknown  COVID-19 specific Treatment: Solu-Medrol 1/23 > 1/25  Antimicrobials:  Ceftriaxone 1/20 > 1/23 Keflex 1/24 >  DVT prophylaxis: Subcutaneous heparin  Subjective: Afebrile.  Vital signs stable.  Saturations 100% on room air. No signif change in mental status. Does not appear to be uncomfortable.   Assessment & Plan:  Severe sepsis POA - pansensitive E. coli UTI Has completed 7 days of antibiotic therapy - no indication of persisting infection  Acute kidney failure on CKD stage IV Baseline creatinine 2.46 -creatinine 6.83 at time of admission -followed by Nephrology at [facility] -required urgent dialysis for significant azotemia and hyperkalemia -no further need for dialysis at present - Nephrology following at [facility]   Acute metabolic encephalopathy with baseline dementia Mental status waxing and waning severely - ? Related to azotemia > discontinued steroid - follow - clear he will need SNF placement  Hyperkalemia corrected with dialysis and improved renal function  Hypokalemia Due to poor oral intake - very gently replace if drops below 3.0   CoViD infection CXR clear -  in absence of hypoxia there is no indication for steroid and therefore this has been discontinued - monitor clinically but appears this is essentially asymptomatic- he is beyond the window to expect a 3 day course of Remdesivr to be helpful   DM2 with  chronic kidney disease A1c 8.4 -CBG quite variable - steroid stopped 1/26 - adjust tx and follow   Dysphagia  continued D1 diet with nectar thick liquids per SLP  Hypernatremia due to free water deficit - resolved  Acute urinary retention Foley catheter present  Underweight - Body mass index is 18.55 kg/m.   Code Status: NO CODE BLUE Family Communication:  Status is: Inpatient  Remains inpatient appropriate because:Unsafe d/c plan   Dispo: The patient is from: Home              Anticipated d/c is to: SNF              Anticipated d/c date is: 2 days              Patient currently is not medically stable to d/c.   Difficult to place patient No   Consultants:  Nephrology  Objective: Blood pressure 124/86, pulse 90, temperature 98.1 F (36.7 C), resp. rate 17, height '5\' 8"'$  (1.727 m), weight 55.3 kg, SpO2 100 %.  Intake/Output Summary (Last 24 hours) at 07/29/2020 0913 Last data filed at 07/29/2020 0424 Gross per 24 hour  Intake -  Output 1251 ml  Net -1251 ml   Filed Weights   07/27/2020 1056 07/25/20 0105  Weight: 72.6 kg 55.3 kg    Examination: General: No acute respiratory distress Lungs: Clear to auscultation bilaterally - no wheezing  Cardiovascular: RRR - no M or rub   Abdomen: NT/ND, soft, bs+, no mass  Extremities: No signif edema bilateral lower extremities  CBC: Recent Labs  Lab 07/11/2020  1058 07/23/20 0407 07/26/20 0804 07/27/20 0731 07/28/20 0554  WBC 18.7*   < > 18.7* 19.8* 18.0*  NEUTROABS 16.2*  --   --   --   --   HGB 13.4   < > 11.1* 12.0* 11.7*  HCT 40.8   < > 34.4* 36.4* 36.1*  MCV 81.9   < > 82.5 83.3 83.8  PLT 824*   < > 490* 465* 475*   < > = values in this interval not displayed.   Basic Metabolic Panel: Recent Labs  Lab 07/26/20 0804 07/27/20 0731 07/28/20 0554 07/29/20 0553  NA 147* 145 141 145  K 3.3* 3.7 3.7 3.1*  CL 106 106 104 110  CO2 '25 24 22 '$ 21*  GLUCOSE 345* 322* 376* 293*  BUN 70* 72* 75* 88*  CREATININE  2.53* 2.45* 2.35* 2.83*  CALCIUM 8.7* 8.6* 8.7* 8.8*  MG 1.9 1.9 2.1  --    GFR: Estimated Creatinine Clearance: 14.7 mL/min (A) (by C-G formula based on SCr of 2.83 mg/dL (H)).  Liver Function Tests: Recent Labs  Lab 07/08/2020 1058 07/29/20 0553  AST 40 29  ALT 38 36  ALKPHOS 97 70  BILITOT 1.3* 0.3  PROT 9.0* 6.4*  ALBUMIN 3.6 2.7*    Coagulation Profile: Recent Labs  Lab 07/23/20 0407  INR 1.1    HbA1C: Hgb A1c MFr Bld  Date/Time Value Ref Range Status  07/21/2020 03:26 PM 8.4 (H) 4.8 - 5.6 % Final    Comment:    (NOTE) Pre diabetes:          5.7%-6.4%  Diabetes:              >6.4%  Glycemic control for   <7.0% adults with diabetes   09/12/2018 05:40 AM 11.1 (H) 4.8 - 5.6 % Final    Comment:    (NOTE) Pre diabetes:          5.7%-6.4% Diabetes:              >6.4% Glycemic control for   <7.0% adults with diabetes     CBG: Recent Labs  Lab 07/28/20 1216 07/28/20 1602 07/28/20 1648 07/28/20 2107 07/29/20 0838  GLUCAP 351* 157* 132* 169* 269*    Recent Results (from the past 240 hour(s))  SARS Coronavirus 2 by RT PCR (hospital order, performed in [place] hospital lab) Nasopharyngeal Nasopharyngeal Swab     Status: Abnormal   Collection Time: 07/17/2020  1:15 PM   Specimen: Nasopharyngeal Swab  Result Value Ref Range Status   SARS Coronavirus 2 POSITIVE (A) NEGATIVE Final    Comment: RESULT CALLED TO, READ BACK BY AND VERIFIED WITH: ROBIN REGISTER AT 1453 ON 07/13/2020 BY SS (NOTE) SARS-CoV-2 target nucleic acids are DETECTED  SARS-CoV-2 RNA is generally detectable in upper respiratory specimens  during the acute phase of infection.  Positive results are indicative  of the presence of the identified virus, but do not rule out bacterial infection or co-infection with other pathogens not detected by the test.  Clinical correlation with patient history and  other diagnostic information is necessary to determine patient infection status.  The  expected result is negative.  Fact Sheet for Patients:   StrictlyIdeas.no   Fact Sheet for Healthcare Providers:   BankingDealers.co.za    This test is not yet approved or cleared by the [place] FDA and  has been authorized for detection and/or diagnosis of SARS-CoV-2 by FDA under an Emergency Use Authorization (EUA).  This EUA will  remain in effect (meaning th is test can be used) for the duration of  the COVID-19 declaration under Section 564(b)(1) of the Act, 21 U.S.C. section 360-bbb-3(b)(1), unless the authorization is terminated or revoked sooner.  Performed at [facility], [address] [place], [place] [address]   Culture, blood (single)     Status: None   Collection Time: 08/02/2020  2:17 PM   Specimen: BLOOD  Result Value Ref Range Status   Specimen Description BLOOD RIGHT ANTECUBITAL  Final   Special Requests   Final    BOTTLES DRAWN AEROBIC AND ANAEROBIC Blood Culture adequate volume   Culture   Final    NO GROWTH 5 DAYS Performed at [facility], [address] [place], [place] [address]    Report Status 07/27/2020 FINAL  Final  Urine Culture     Status: Abnormal   Collection Time: 07/17/2020  2:17 PM   Specimen: Urine, Random  Result Value Ref Range Status   Specimen Description   Final    URINE, RANDOM Performed at [facility], [address] [place], [place] [address]    Special Requests   Final    NONE Performed at [facility], [place], [place] [address]    Culture >=100,000 COLONIES/mL ESCHERICHIA COLI (A)  Final   Report Status 07/25/2020 FINAL  Final   Organism ID, Bacteria ESCHERICHIA COLI (A)  Final      Susceptibility   Escherichia coli - MIC*    AMPICILLIN <=2 SENSITIVE Sensitive     CEFAZOLIN <=4 SENSITIVE Sensitive     CEFEPIME <=0.12 SENSITIVE Sensitive     CEFTRIAXONE <=0.25 SENSITIVE Sensitive     CIPROFLOXACIN <=0.25 SENSITIVE  Sensitive     GENTAMICIN <=1 SENSITIVE Sensitive     IMIPENEM <=0.25 SENSITIVE Sensitive     NITROFURANTOIN <=16 SENSITIVE Sensitive     TRIMETH/SULFA <=20 SENSITIVE Sensitive     AMPICILLIN/SULBACTAM <=2 SENSITIVE Sensitive     PIP/TAZO <=4 SENSITIVE Sensitive     * >=100,000 COLONIES/mL ESCHERICHIA COLI     Scheduled Meds: . cephALEXin  250 mg Oral Q12H  . Chlorhexidine Gluconate Cloth  6 each Topical Daily  . heparin  5,000 Units Subcutaneous Q8H  . insulin aspart  0-20 Units Subcutaneous TID WC  . insulin glargine  10 Units Subcutaneous Daily  . linagliptin  5 mg Oral Daily     LOS: 7 days   Cherene Altes, MD [facility] Office  [phone] Pager - Text Page per Amion  If 7PM-7AM, please contact night-coverage per Amion 07/29/2020, 9:13 AM

## 2020-07-29 NOTE — Progress Notes (Signed)
Physical Therapy Treatment Patient Details Name: Shawn May MRN: [id number] DOB: April 01, 1932 Today's Date: 07/29/2020    History of Present Illness Godwin Jaracz is an 32yoM who comes to [facility] on 1/20 c SOB, weakness, K+: 7.1. Pt positive COVID19. PMH: dementia, CVA, DM, CKD3, HTN. Per wife, ad baseline pt fully independent with intermittnent AMS due to dementia, otherwise mobile, no falls history.    PT Comments    Author able to catch pt in more alert state this AM, unfortunately did not change his level of required assists, nor his standing tolerance. Pt unable to take a single step 2/2 weakness, mentation worse after effort. Will continue to follow.     Follow Up Recommendations  SNF;Supervision for mobility/OOB;Supervision - Intermittent     Equipment Recommendations  None recommended by PT    Recommendations for Other Services       Precautions / Restrictions Precautions Precautions: Fall Restrictions Weight Bearing Restrictions: No    Mobility  Bed Mobility Overal bed mobility: Needs Assistance Bed Mobility: Supine to Sit;Sit to Supine     Supine to sit: Max assist Sit to supine: Max assist   General bed mobility comments: total assist to pull up in bed  Transfers Overall transfer level: Needs assistance Equipment used: 1 person hand held assist Transfers: Sit to/from Stand Sit to Stand: Mod assist         General transfer comment: stays up for ~ 30sec, attempting to stumble forward to grasp counter, but unable to take a step.  Ambulation/Gait                 Stairs             Wheelchair Mobility    Modified Rankin (Stroke Patients Only)       Balance Overall balance assessment: Needs assistance   Sitting balance-Leahy Scale: Zero Sitting balance - Comments: constant support needed     Standing balance-Leahy Scale: Zero Standing balance comment: constant support needed                            Cognition  Arousal/Alertness: Awake/alert Behavior During Therapy: Flat affect Overall Cognitive Status: History of cognitive impairments - at baseline (Not at his cognitive baseline at present)                                 General Comments: follows simple commands intermittently <50% of time inititally, worse after exersion. Oriented to self only      Exercises      General Comments        Pertinent Vitals/Pain Pain Assessment: No/denies pain    Home Living                      Prior Function            PT Goals (current goals can now be found in the care plan section) Acute Rehab PT Goals Patient Stated Goal: get stronger/more independent PT Goal Formulation: With family Time For Goal Achievement: 08/11/20 Potential to Achieve Goals: Fair Progress towards PT goals: Progressing toward goals    Frequency    Min 2X/week      PT Plan Current plan remains appropriate    Co-evaluation              AM-PAC PT "6 Clicks" Mobility   Outcome Measure  Help needed turning from your back to your side while in a flat bed without using bedrails?: A Lot Help needed moving from lying on your back to sitting on the side of a flat bed without using bedrails?: A Lot Help needed moving to and from a bed to a chair (including a wheelchair)?: Total Help needed standing up from a chair using your arms (e.g., wheelchair or bedside chair)?: Total Help needed to walk in hospital room?: Total Help needed climbing 3-5 steps with a railing? : Total 6 Click Score: 8    End of Session Equipment Utilized During Treatment: Gait belt Activity Tolerance: Patient limited by lethargy;Patient limited by fatigue Patient left: in bed;with call bell/phone within reach;with bed alarm set Nurse Communication: Mobility status PT Visit Diagnosis: Difficulty in walking, not elsewhere classified (R26.2);Other symptoms and signs involving the nervous system (R29.898)     Time:  [id number] PT Time Calculation (min) (ACUTE ONLY): 13 min  Charges:  $Therapeutic Exercise: 8-22 mins                     4:46 PM, 07/29/20 Etta Grandchild, PT, DPT Physical Therapist - [facility]  [phone] ([place])    Buccola,Allan C 07/29/2020, 4:45 PM

## 2020-07-29 NOTE — Progress Notes (Signed)
[facility]  ROUNDING NOTE   Subjective:   UOP 1746m.  Off IV fluids  Pleasantly confused this morning.   Objective:  Vital signs in last 24 hours:  Temp:  [97.8 F (36.6 C)-98.9 F (37.2 C)] 98.1 F (36.7 C) (01/27 0835) Pulse Rate:  [84-109] 90 (01/27 0835) Resp:  [16-20] 17 (01/27 0835) BP: (109-177)/(70-112) 124/86 (01/27 0835) SpO2:  [97 %-100 %] 100 % (01/27 0835)  Weight change:  Filed Weights   07/05/2020 1056 07/25/20 0105  Weight: 72.6 kg 55.3 kg    Intake/Output: I/O last 3 completed shifts: In: -  Out: 2276 [Urine:2276]   Intake/Output this shift:  No intake/output data recorded.  Physical Exam: General: NAD, laying in bed  Head: Normocephalic, atraumatic. Dry oral mucosal membranes  Eyes: Anicteric, PERRL  Neck: Supple, trachea midline  Lungs:  Clear to auscultation  Heart: Regular rate and rhythm  Abdomen:  Soft, nontender,   Extremities:  no peripheral edema.  Neurologic: confused  Skin: No lesions   Access Right femoral temp HD catheter 1/20 Dr. DLucky Cowboy   Basic Metabolic Panel: Recent Labs  Lab 07/24/20 0500 07/25/20 [id number]/22 0804 07/27/20 0731 07/28/20 0554 07/29/20 0553  NA 151* 152* 147* 145 141 145  K 3.8 3.9 3.3* 3.7 3.7 3.1*  CL 105 107 106 106 104 110  CO2 '25 25 25 24 22 '$ 21*  GLUCOSE 134* 207* 345* 322* 376* 293*  BUN 90* 75* 70* 72* 75* 88*  CREATININE 3.10* 2.81* 2.53* 2.45* 2.35* 2.83*  CALCIUM 9.2 9.4 8.7* 8.6* 8.7* 8.8*  MG 2.1 2.0 1.9 1.9 2.1  --     Liver Function Tests: Recent Labs  Lab 07/08/2020 1058 07/29/20 0553  AST 40 29  ALT 38 36  ALKPHOS 97 70  BILITOT 1.3* 0.3  PROT 9.0* 6.4*  ALBUMIN 3.6 2.7*   No results for input(s): LIPASE, AMYLASE in the last 168 hours. Recent Labs  Lab 07/29/20 0553  AMMONIA 17    CBC: Recent Labs  Lab 08/02/2020 1058 07/23/20 0407 07/24/20 0500 07/25/20 0527 07/26/20 0804 07/27/20 0731 07/28/20 0554  WBC 18.7*   < > 18.1* 16.6* 18.7* 19.8* 18.0*   NEUTROABS 16.2*  --   --   --   --   --   --   HGB 13.4   < > 10.9* 12.2* 11.1* 12.0* 11.7*  HCT 40.8   < > 32.4* 37.0* 34.4* 36.4* 36.1*  MCV 81.9   < > 82.2 84.1 82.5 83.3 83.8  PLT 824*   < > 539* 519* 490* 465* 475*   < > = values in this interval not displayed.    Cardiac Enzymes: No results for input(s): CKTOTAL, CKMB, CKMBINDEX, TROPONINI in the last 168 hours.  BNP: Invalid input(s): POCBNP  CBG: Recent Labs  Lab 07/28/20 1216 07/28/20 1602 07/28/20 1648 07/28/20 2107 07/29/20 0838  GLUCAP 351* 157* 132* 169* 255    Microbiology: Results for orders placed or performed during the hospital encounter of 07/04/2020  SARS Coronavirus 2 by RT PCR (hospital order[facility]) Nasopharyngeal Nasopharyngeal Swab     Status: Abnormal   Collection Time: 07/25/2020  1:15 PM   Specimen: Nasopharyngeal Swab  Result Value Ref Range Status   SARS Coronavirus 2 POSITIVE (A) NEGATIVE Final    Comment: RESULT CALLED TO, READ BACK BY AND VERIFIED WITH: ROBIN REGISTER AT 1453 ON 07/20/2020 BY SS (NOTE) SARS-CoV-2 target nucleic acids are DETECTED  SARS-CoV-2  RNA is generally detectable in upper respiratory specimens  during the acute phase of infection.  Positive results are indicative  of the presence of the identified virus, but do not rule out bacterial infection or co-infection with other pathogens not detected by the test.  Clinical correlation with patient history and  other diagnostic information is necessary to determine patient infection status.  The expected result is negative.  Fact Sheet for Patients:   StrictlyIdeas.no   Fact Sheet for Healthcare Providers:   BankingDealers.co.za    This test is not yet approved or cleared by the [place] FDA and  has been authorized for detection and/or diagnosis of SARS-CoV-2 by FDA under an Emergency Use Authorization (EUA).  This EUA will remain in  effect (meaning th is test can be used) for the duration of  the COVID-19 declaration under Section 564(b)(1) of the Act, 21 U.S.C. section 360-bbb-3(b)(1), unless the authorization is terminated or revoked sooner.  Performed at [facility], [address] [place], [place] [address]   Culture, blood (single)     Status: None   Collection Time: 07/26/2020  2:17 PM   Specimen: BLOOD  Result Value Ref Range Status   Specimen Description BLOOD RIGHT ANTECUBITAL  Final   Special Requests   Final    BOTTLES DRAWN AEROBIC AND ANAEROBIC Blood Culture adequate volume   Culture   Final    NO GROWTH 5 DAYS Performed at [facility], [address] [place], [place] [address]    Report Status 07/27/2020 FINAL  Final  Urine Culture     Status: Abnormal   Collection Time: 07/10/2020  2:17 PM   Specimen: Urine, Random  Result Value Ref Range Status   Specimen Description   Final    URINE, RANDOM Performed at [facility], [address], [place], [place] [address]    Special Requests   Final    NONE Performed at [facility], [place], [place] [address]    Culture >=100,000 COLONIES/mL ESCHERICHIA COLI (A)  Final   Report Status 07/25/2020 FINAL  Final   Organism ID, Bacteria ESCHERICHIA COLI (A)  Final      Susceptibility   Escherichia coli - MIC*    AMPICILLIN <=2 SENSITIVE Sensitive     CEFAZOLIN <=4 SENSITIVE Sensitive     CEFEPIME <=0.12 SENSITIVE Sensitive     CEFTRIAXONE <=0.25 SENSITIVE Sensitive     CIPROFLOXACIN <=0.25 SENSITIVE Sensitive     GENTAMICIN <=1 SENSITIVE Sensitive     IMIPENEM <=0.25 SENSITIVE Sensitive     NITROFURANTOIN <=16 SENSITIVE Sensitive     TRIMETH/SULFA <=20 SENSITIVE Sensitive     AMPICILLIN/SULBACTAM <=2 SENSITIVE Sensitive     PIP/TAZO <=4 SENSITIVE Sensitive     * >=100,000 COLONIES/mL ESCHERICHIA COLI    Coagulation Studies: No results for input(s): LABPROT, INR in the last 72  hours.  Urinalysis: No results for input(s): COLORURINE, LABSPEC, PHURINE, GLUCOSEU, HGBUR, BILIRUBINUR, KETONESUR, PROTEINUR, UROBILINOGEN, NITRITE, LEUKOCYTESUR in the last 72 hours.  Invalid input(s): APPERANCEUR    Imaging: No results found.   Medications:    . Chlorhexidine Gluconate Cloth  6 each Topical Daily  . heparin  5,000 Units Subcutaneous Q8H  . insulin aspart  0-20 Units Subcutaneous TID WC  . insulin glargine  12 Units Subcutaneous Daily  . linagliptin  5 mg Oral Daily   acetaminophen **OR** acetaminophen, haloperidol lactate, hydrALAZINE, ondansetron **OR** ondansetron (ZOFRAN) IV  Assessment/ Plan:  Mr. Shawn Yarber  May is a 87 y.o. black male with dementia, diabetes mellitus type 2, hyperlipidemia, hypertension, who was admitted to [facility] on 07/21/2020 for evaluation of shortness of breath and weakness.  1.  Acute kidney injury on chronic kidney disease stage IV with proteinuria: baseline creatinine of 2.19, GFR of 26 on 12/25/2019.  Followed by [facility] nephrology as an outpatient.   Suspect acute kidney injury now due to severe volume depletion and dehydration.   Emergent hemodialysis on admission.  Chronic kidney disease secondary to diabetic nephropathy - no indication for further dialysis. Nonoliguric urine output.   2.  Hypernatremia: secondary to poor PO intake - Encourage PO intake.     LOS: 7 Taylia Berber 1/27/20229:38 AM

## 2020-07-29 NOTE — Evaluation (Signed)
Occupational Therapy Evaluation Patient Details Name: Shawn May MRN: [id number] DOB: 05-Mar-1932 Today's Date: 07/29/2020    History of Present Illness Kyran Smethers is an 86yoM who comes [facility] on 1[facility]20 c SOB, weakness, K+: 7.1. Pt positive COVID19. PMH: dementia, CVA, DM, CKD3, HTN. Per wife, ad baseline pt fully independent with intermittnent AMS due to dementia, otherwise mobile, no falls history.   Clinical Impression   Pt was seen for OT evaluation this date. Pt sleepy, oriented to self only, and following <25% of simple commands. Pt required MOD A for bed mobility to improve positioning and joint protection/skin integrity. Unsafe to attempt OOB this session. Currently pt demonstrates impairments as described below (See OT problem list) which functionally limit his ability to perform ADL/self-care tasks. Pt currently requires MAX A for LB ADL.  Pt would benefit from skilled OT services to address noted impairments and functional limitations (see below for any additional details) in order to maximize safety and independence while minimizing falls risk and caregiver burden. Upon hospital discharge, recommend STR to maximize pt safety and return to PLOF.     Follow Up Recommendations  SNF    Equipment Recommendations  3 in 1 bedside commode    Recommendations for Other Services       Precautions / Restrictions Precautions Precautions: Fall Restrictions Weight Bearing Restrictions: No      Mobility Bed Mobility               General bed mobility comments: deferred, unsafe to attempt 2/2 lethargy    Transfers                      Balance                                           ADL either performed or assessed with clinical judgement   ADL                                               Vision         Perception     Praxis      Pertinent Vitals/Pain Pain Assessment: No/denies pain     Hand Dominance      Extremity/Trunk Assessment Upper Extremity Assessment Upper Extremity Assessment: Generalized weakness;Difficult to assess due to impaired cognition   Lower Extremity Assessment Lower Extremity Assessment: Generalized weakness;Difficult to assess due to impaired cognition       Communication     Cognition Arousal/Alertness: Lethargic Behavior During Therapy: Flat affect Overall Cognitive Status: No family/caregiver present to determine baseline cognitive functioning                                 General Comments: follows simple commands intermittently <25%, oriented to self only   General Comments       Exercises     Shoulder Instructions      Home Living Family/patient expects to be discharged to:: Private residence Living Arrangements: Spouse/significant other Early Chars (who is on HD)) Available Help at Discharge: Family Type of Home: Mobile home Home Access: Stairs to enter Entrance Stairs-Number of Steps: 5 Entrance Stairs-Rails: Left;Right;Can reach both Home Layout: One  level               Home Equipment: None;Bedside commode          Prior Functioning/Environment Level of Independence: Independent        Comments: Wife drives, wife took license away due to dementia, no falls issues or baalnce problems at baseline. indepndent with ADL.        OT Problem List: Decreased strength;Decreased cognition;Decreased activity tolerance;Impaired balance (sitting and/or standing);Decreased knowledge of use of DME or AE;Decreased safety awareness      OT Treatment/Interventions: Self-care/ADL training;Therapeutic exercise;Therapeutic activities;DME and/or AE instruction;Patient/family education;Balance training    OT Goals(Current goals can be found in the care plan section) Acute Rehab OT Goals Patient Stated Goal: get stronger/more independent OT Goal Formulation: With patient Time For Goal Achievement: 08/12/20 Potential to Achieve  Goals: Good ADL Goals Pt Will Perform Grooming: sitting;with min assist Pt Will Perform Lower Body Dressing: sit to/from stand;with mod assist Pt Will Transfer to Toilet: stand pivot transfer;with min assist;with mod assist  OT Frequency: Min 1X/week   Barriers to D/C:            Co-evaluation              AM-PAC OT "6 Clicks" Daily Activity     Outcome Measure Help from another person eating meals?: A Little Help from another person taking care of personal grooming?: A Little Help from another person toileting, which includes using toliet, bedpan, or urinal?: A Lot Help from another person bathing (including washing, rinsing, drying)?: A Lot Help from another person to put on and taking off regular upper body clothing?: A Lot Help from another person to put on and taking off regular lower body clothing?: A Lot 6 Click Score: 14   End of Session    Activity Tolerance: Patient limited by lethargy Patient left: in bed;with call bell/phone within reach;with bed alarm set  OT Visit Diagnosis: Other abnormalities of gait and mobility (R26.89);Muscle weakness (generalized) (M62.81);Other symptoms and signs involving cognitive function                Time: 1003-1017 OT Time Calculation (min): 14 min Charges:  OT General Charges $OT Visit: 1 Visit OT Evaluation $OT Eval Moderate Complexity: 1 Mod  Jeni Salles, MPH, MS, OTR/L ascom [phone] 07/29/20, 1:04 PM

## 2020-07-29 NOTE — Progress Notes (Signed)
Attempted call to wife to discuss SNF recommendation. Left a VM requesting a return call.  Shawn May, [name]

## 2020-07-29 NOTE — Progress Notes (Addendum)
Speech Language Pathology Treatment: Dysphagia  Patient Details Name: Shawn May MRN: [id number] DOB: 07/05/1931 Today's Date: 07/29/2020 Time: 1530-1610 SLP Time Calculation (min) (ACUTE ONLY): 40 min  Assessment / Plan / Recommendation Clinical Impression  Pt seen today for ongoing assessment of swallowing; toleration of diet and trials to hopefully upgrade diet if appropriate. Pt continues to present w/ declined Cognitive status and awareness; mumbled speech and cues required for follow through w/ tasks. Noted pt's Baseline Cognitive decline, Dementia in MD notes. NSG reported good toleration of the current, modified diet w/ no difficulties swallowing and good intake overall.  He appears to present w/ continued oropharyngeal phase dysphagia is impacted in his awareness during oral intake tasks d/t declined Cognitive status, Baseline Dementia. The Cognitive decline impacts his overall awareness/engagement w/ po tasks which can increase risk for aspiration. His risk for aspiration is reduced when following general aspiration precautions, when supported during the meal w/ setup and guidance w/ self-feeding, and when using a Modified consistency diet. He requires mod verbal/tactile/visual cues for orientation to, and follow through w/, eating/drinking at meals using aspiration precautions. REDUCING DISTRACTIONS is beneficial during po intake.  Pt was positioned upright in bed then supported in self-feeding/consuming trials of thin liquids VIA CUP, Nectar liquids via cup/straw, and purees. No overt clinical s/s of aspiration noted; no decline in phonations, no cough, and no decline in respiratory status during/post trials w/ trials EXCEPT for Overt coughing w/ trials of thin liquids -- Mod+ coughing followed 2/3 trials. Oral phase was grossly [facility] for bolus management and oral clearing of the boluses given. Slight increased Time noted has he used lingual sweeping to clear orally b/t bites of thicker  puree(magic cup). Trials of Nectar liquids given to Alternate bites/sips to aid clearing, intake overall good w/ modified foods/liquids as NSG noted. Discussed diet consistency of foods/liquids; aspiration of thin liquids w/ NSG.  Due to pt's increased risk for aspiration thus Pulmonary decline in light of illness and Baseline Dementia, recommend continue a Dysphagia level 1(puree diet) w/ Nectar liquids for safer oral intake in general and support w/ nutritional intake for healing; general aspiration precautions; reduce Distractions during meals. Pills Crushed in Puree for safer swallowing(found a whole pill on pt's shirt not swallowed - NSG informed). Support at all meals d/t Cognitive decline/Dementia. Offer more drink supplements to support nutrition also -- pt can Hold himself to Drink. Recommend Dietician f/u. Recommend PLEASURE Single Ice Chip trials post thorough oral care w/ NSG Supervision in b/t meals. Stop if increased s/s of aspiration noted.  No further skilled ST services indicated at this time as pt would benefit from this Dysphagia diet consistency at this time. Further services can be had at next venue of care as pt improves.  NSG/MD updated.  Noted Palliative Care following for GOC.     HPI HPI: Pt is a 87 y.o. male with medical history significant for dementia??  Alzheimer's versus vascular, history of CVA, diabetes mellitus with complications of stage III chronic kidney disease, hypertension and dyslipidemia who was brought into the ER by EMS for evaluation of shortness of breath and weakness.  Per patient's wife he has had poor oral intake for the last couple of days and has been increasingly weak.  Patient states that he has not had any falls at home.  Per EMS his room air pulse oximetry at rest was in the 80s and improved following oxygen supplementation to the low 90s.  Patient is agitated and unable to  provide any history at this time. Nephrology was consulted by emergency room  physician and a temporary dialysis catheter was inserted for hemodialysis.  Patient appears to be septic most likely from urinary source and received broad-spectrum antibiotic therapy with vancomycin and cefepime.  He will be admitted to the hospital for further evaluation.      SLP Plan  Continue with current plan of care (Palliative Care for [place])       Recommendations  Diet recommendations: Dysphagia 1 (puree);Nectar-thick liquid (can have single ice chips b/t meals w/ NSG) Liquids provided via: Cup;Straw (monitor) Medication Administration: Crushed with puree (for safer swallowing) Supervision: Staff to assist with self feeding;Full supervision/cueing for compensatory strategies Compensations: Minimize environmental distractions;Slow rate;Small sips/bites;Lingual sweep for clearance of pocketing;Multiple dry swallows after each bite/sip;Follow solids with liquid Postural Changes and/or Swallow Maneuvers: Seated upright 90 degrees;Upright 30-60 min after meal                General recommendations:  (Dietiician f/u; Palliative Care f/u) Oral Care Recommendations: Oral care BID;Oral care before and after PO;Staff/trained caregiver to provide oral care Follow up Recommendations: Skilled Nursing facility (TBD) SLP Visit Diagnosis: Dysphagia, oropharyngeal phase (R13.12) (Cognitive decline) Plan: Continue with current plan of care (Palliative Care for [place])       GO                 Shawn Kenner, MS, CCC-SLP Speech Language Pathologist Rehab Services [phone] [facility] 07/29/2020, 4:59 PM

## 2020-07-29 NOTE — Plan of Care (Signed)
PMT note:  Attempted to reach patient's wife to discuss [place] unsuccessfully on her cell phone and home phone. VM left on home phone.

## 2020-07-30 DIAGNOSIS — N179 Acute kidney failure, unspecified: Secondary | ICD-10-CM | POA: Diagnosis not present

## 2020-07-30 DIAGNOSIS — F0391 Unspecified dementia with behavioral disturbance: Secondary | ICD-10-CM | POA: Diagnosis not present

## 2020-07-30 DIAGNOSIS — N390 Urinary tract infection, site not specified: Secondary | ICD-10-CM | POA: Diagnosis not present

## 2020-07-30 DIAGNOSIS — A419 Sepsis, unspecified organism: Secondary | ICD-10-CM | POA: Diagnosis not present

## 2020-07-30 LAB — RENAL FUNCTION PANEL
Albumin: 2.9 g/dL — ABNORMAL LOW (ref 3.5–5.0)
Anion gap: 14 (ref 5–15)
BUN: 85 mg/dL — ABNORMAL HIGH (ref 8–23)
CO2: 25 mmol/L (ref 22–32)
Calcium: 9 mg/dL (ref 8.9–10.3)
Chloride: 110 mmol/L (ref 98–111)
Creatinine, Ser: 2.4 mg/dL — ABNORMAL HIGH (ref 0.61–1.24)
GFR, Estimated: 26 mL/min — ABNORMAL LOW (ref >60)
Glucose, Bld: 212 mg/dL — ABNORMAL HIGH (ref 70–99)
Phosphorus: 2.8 mg/dL (ref 2.5–4.6)
Potassium: 3.3 mmol/L — ABNORMAL LOW (ref 3.5–5.1)
Sodium: 149 mmol/L — ABNORMAL HIGH (ref 135–145)

## 2020-07-30 LAB — GLUCOSE, CAPILLARY
Glucose-Capillary: 235 mg/dL — ABNORMAL HIGH (ref 70–99)
Glucose-Capillary: 142 mg/dL — ABNORMAL HIGH (ref 70–99)
Glucose-Capillary: 144 mg/dL — ABNORMAL HIGH (ref 70–99)
Glucose-Capillary: 261 mg/dL — ABNORMAL HIGH (ref 70–99)

## 2020-07-30 MED ORDER — INSULIN GLARGINE 100 UNIT/ML ~~LOC~~ SOLN
16.0000 [IU] | Freq: Every day | SUBCUTANEOUS | Status: DC
  Administered 2020-07-31: 10:00:00 16 [IU] via SUBCUTANEOUS
  Filled 2020-07-30 (×2): qty 0.16

## 2020-07-30 MED ORDER — SENNOSIDES-DOCUSATE SODIUM 8.6-50 MG PO TABS
1.0000 | ORAL_TABLET | Freq: Two times a day (BID) | ORAL | Status: DC
  Administered 2020-07-30 – 2020-08-01 (×5): 1 via ORAL
  Filled 2020-07-30 (×5): qty 1

## 2020-07-30 NOTE — Progress Notes (Signed)
[facility]  ROUNDING NOTE   Subjective:   Na 149 Off IV fluids  Pleasantly confused this morning.   Objective:  Vital signs in last 24 hours:  Temp:  [96.8 F (36 C)-98.7 F (37.1 C)] 97.4 F (36.3 C) (01/28 1209) Pulse Rate:  [89-107] 103 (01/28 1209) Resp:  [16-20] 16 (01/28 1209) BP: (105-156)/(73-103) 119/86 (01/28 1209) SpO2:  [85 %-100 %] 100 % (01/28 1209)  Weight change:  Filed Weights   07/25/2020 1056 07/25/20 0105  Weight: 72.6 kg 55.3 kg    Intake/Output: I/O last 3 completed shifts: In: -  Out: 550 [Urine:550]   Intake/Output this shift:  No intake/output data recorded.  Physical Exam: General: NAD, laying in bed  Head: Normocephalic, atraumatic. Dry oral mucosal membranes  Eyes: Anicteric, PERRL  Neck: Supple, trachea midline  Lungs:  Clear to auscultation  Heart: Regular rate and rhythm  Abdomen:  Soft, nontender,   Extremities:  no peripheral edema.  Neurologic: confused  Skin: No lesions   Access Right femoral temp HD catheter 1/20 Dr. Lucky Cowboy    Basic Metabolic Panel: Recent Labs  Lab 07/24/20 0500 07/25/20 [id number] 07/26/20 0804 07/27/20 0731 07/28/20 0554 07/29/20 0553 07/30/20 0522  NA 151* 152* 147* 145 141 145 149*  K 3.8 3.9 3.3* 3.7 3.7 3.1* 3.3*  CL 105 107 106 106 104 110 110  CO2 '25 25 25 24 22 '$ 21* 25  GLUCOSE 134* 207* 345* 322* 376* 293* 212*  BUN 90* 75* 70* 72* 75* 88* 85*  CREATININE 3.10* 2.81* 2.53* 2.45* 2.35* 2.83* 2.40*  CALCIUM 9.2 9.4 8.7* 8.6* 8.7* 8.8* 9.0  MG 2.1 2.0 1.9 1.9 2.1  --   --   PHOS  --   --   --   --   --   --  2.8    Liver Function Tests: Recent Labs  Lab 07/29/20 0553 07/30/20 0522  AST 29  --   ALT 36  --   ALKPHOS 70  --   BILITOT 0.3  --   PROT 6.4*  --   ALBUMIN 2.7* 2.9*   No results for input(s): LIPASE, AMYLASE in the last 168 hours. Recent Labs  Lab 07/29/20 0553  AMMONIA 17    CBC: Recent Labs  Lab 07/24/20 0500 07/25/20 0527 07/26/20 0804  07/27/20 0731 07/28/20 0554  WBC 18.1* 16.6* 18.7* 19.8* 18.0*  HGB 10.9* 12.2* 11.1* 12.0* 11.7*  HCT 32.4* 37.0* 34.4* 36.4* 36.1*  MCV 82.2 84.1 82.5 83.3 83.8  PLT 539* 519* 490* 465* 475*    Cardiac Enzymes: No results for input(s): CKTOTAL, CKMB, CKMBINDEX, TROPONINI in the last 168 hours.  BNP: Invalid input(s): POCBNP  CBG: Recent Labs  Lab 07/29/20 1217 07/29/20 1556 07/29/20 2027 07/30/20 0741 07/30/20 1215  GLUCAP 328* 196* 234* 235* 142*    Microbiology: Results for orders placed or performed during the hospital encounter of 07/21/2020  SARS Coronavirus 2 by RT PCR (hospital order, performed in [facility] hospital lab) Nasopharyngeal Nasopharyngeal Swab     Status: Abnormal   Collection Time: 07/08/2020  1:15 PM   Specimen: Nasopharyngeal Swab  Result Value Ref Range Status   SARS Coronavirus 2 POSITIVE (A) NEGATIVE Final    Comment: RESULT CALLED TO, READ BACK BY AND VERIFIED WITH: ROBIN REGISTER AT 1453 ON 07/21/2020 BY SS (NOTE) SARS-CoV-2 target nucleic acids are DETECTED  SARS-CoV-2 RNA is generally detectable in upper respiratory specimens  during the acute phase of infection.  Positive results  are indicative  of the presence of the identified virus, but do not rule out bacterial infection or co-infection with other pathogens not detected by the test.  Clinical correlation with patient history and  other diagnostic information is necessary to determine patient infection status.  The expected result is negative.  Fact Sheet for Patients:   StrictlyIdeas.no   Fact Sheet for Healthcare Providers:   BankingDealers.co.za    This test is not yet approved or cleared by the [place] FDA and  has been authorized for detection and/or diagnosis of SARS-CoV-2 by FDA under an Emergency Use Authorization (EUA).  This EUA will remain in effect (meaning th is test can be used) for the duration of  the COVID-19  declaration under Section 564(b)(1) of the Act, 21 U.S.C. section 360-bbb-3(b)(1), unless the authorization is terminated or revoked sooner.  Performed at [facility], [address] [place], [place] [address]   Culture, blood (single)     Status: None   Collection Time: 07/13/2020  2:17 PM   Specimen: BLOOD  Result Value Ref Range Status   Specimen Description BLOOD RIGHT ANTECUBITAL  Final   Special Requests   Final    BOTTLES DRAWN AEROBIC AND ANAEROBIC Blood Culture adequate volume   Culture   Final    NO GROWTH 5 DAYS Performed at [facility], [address] [place], [place] [address]    Report Status 07/27/2020 FINAL  Final  Urine Culture     Status: Abnormal   Collection Time: 07/15/2020  2:17 PM   Specimen: Urine, Random  Result Value Ref Range Status   Specimen Description   Final    URINE, RANDOM Performed at [facility], [address], [place], [place] [address]    Special Requests   Final    NONE Performed at [facility], [place], [place] [address]    Culture >=100,000 COLONIES/mL ESCHERICHIA COLI (A)  Final   Report Status 07/25/2020 FINAL  Final   Organism ID, Bacteria ESCHERICHIA COLI (A)  Final      Susceptibility   Escherichia coli - MIC*    AMPICILLIN <=2 SENSITIVE Sensitive     CEFAZOLIN <=4 SENSITIVE Sensitive     CEFEPIME <=0.12 SENSITIVE Sensitive     CEFTRIAXONE <=0.25 SENSITIVE Sensitive     CIPROFLOXACIN <=0.25 SENSITIVE Sensitive     GENTAMICIN <=1 SENSITIVE Sensitive     IMIPENEM <=0.25 SENSITIVE Sensitive     NITROFURANTOIN <=16 SENSITIVE Sensitive     TRIMETH/SULFA <=20 SENSITIVE Sensitive     AMPICILLIN/SULBACTAM <=2 SENSITIVE Sensitive     PIP/TAZO <=4 SENSITIVE Sensitive     * >=100,000 COLONIES/mL ESCHERICHIA COLI    Coagulation Studies: No results for input(s): LABPROT, INR in the last 72 hours.  Urinalysis: No results for input(s): COLORURINE, LABSPEC, PHURINE, GLUCOSEU,  HGBUR, BILIRUBINUR, KETONESUR, PROTEINUR, UROBILINOGEN, NITRITE, LEUKOCYTESUR in the last 72 hours.  Invalid input(s): APPERANCEUR    Imaging: No results found.   Medications:    . Chlorhexidine Gluconate Cloth  6 each Topical Daily  . donepezil  5 mg Oral BID  . heparin  5,000 Units Subcutaneous Q8H  . insulin aspart  0-20 Units Subcutaneous TID WC  . [START ON 07/31/2020] insulin glargine  16 Units Subcutaneous Daily  . linagliptin  5 mg Oral Daily  . memantine  5 mg Oral BID  . senna-docusate  1 tablet Oral BID   acetaminophen **OR** acetaminophen, haloperidol lactate, hydrALAZINE, ondansetron **OR** ondansetron (  ZOFRAN) IV  Assessment/ Plan:  Mr. Shawn May is a 87 y.o. black male with dementia, diabetes mellitus type 2, hyperlipidemia, hypertension, who was admitted to [facility] on 07/13/2020 for evaluation of shortness of breath and weakness.  1.  Acute kidney injury on chronic kidney disease stage IV with proteinuria: baseline creatinine of 2.19, GFR of 26 on 12/25/2019.  Followed by [facility] nephrology as an outpatient.   Suspect acute kidney injury now due to severe volume depletion and dehydration.   Emergent hemodialysis on admission.  Chronic kidney disease secondary to diabetic nephropathy - no indication for further dialysis. Nonoliguric urine output.   2.  Hypernatremia: secondary to poor PO intake. Improved with dextrose infusion but now trending back up now patient is off IV fluids.  - Encourage PO intake. Appreciate SLP input    LOS: 8 Gaige Fussner 07/30/00/10/2020:52 PM

## 2020-07-30 NOTE — Progress Notes (Signed)
Occupational Therapy Treatment Patient Details Name: Shawn May MRN: [id number] DOB: January 18, 1932 Today's Date: 07/30/2020    History of present illness Shawn May is an 55yoM who comes to [facility] on 1/20 c SOB, weakness, K+: 7.1. Pt positive COVID19. PMH: dementia, CVA, DM, CKD3, HTN. Per wife, ad baseline pt fully independent with intermittnent AMS due to dementia, otherwise mobile, no falls history.   OT comments  Pt seen for OT treatment this date to f/u re: safety with ADLs. OT engagse pt in bed-level grooming tasks to increase tolerance for UB self care ADLs and to address task sequencing. OT engages pt in the following: Wash/dry hands, Wash/dry face, and Oral care. Pt requires Minimal assistance/Moderate assistance at Bed level with HOB elevated, increased time, and multimodal cues to sequence. Pt continues to benefit from skilled OT. Will continue to follow acutely and based on today's treatment session, still anticipate STR at SNF is likely most appropriate d/c recommendation.    Follow Up Recommendations  SNF    Equipment Recommendations  3 in 1 bedside commode    Recommendations for Other Services      Precautions / Restrictions Precautions Precautions: Fall Restrictions Weight Bearing Restrictions: No       Mobility Bed Mobility Overal bed mobility: Needs Assistance Bed Mobility: Supine to Sit;Sit to Supine     Supine to sit: Max assist Sit to supine: Max assist   General bed mobility comments: able to sit with good balance at EOB  Transfers    Balance                         ADL either performed or assessed with clinical judgement   ADL Overall ADL's : Needs assistance/impaired     Grooming: Wash/dry hands;Wash/dry face;Oral care;Minimal assistance;Moderate assistance;Bed level Grooming Details (indicate cue type and reason): HOB elevated, increased time, multimodal cues to sequence                                      Vision Patient Visual Report: No change from baseline     Perception     Praxis      Cognition Arousal/Alertness: Awake/alert Behavior During Therapy: Flat affect Overall Cognitive Status: History of cognitive impairments - at baseline                                 General Comments: follows simple commands intermittently <25% of time inititally, impulsive, delayed, flat.        Exercises Other Exercises Other Exercises: OT engagse pt in bed-level grooming tasks to increase tolerance for UB self care ADLs and to address task sequencing.   Shoulder Instructions       General Comments      Pertinent Vitals/ Pain       Faces Pain Scale: No hurt  Home Living                                          Prior Functioning/Environment              Frequency  Min 1X/week        Progress Toward Goals  OT Goals(current goals can now be found in the care plan section)  Progress towards OT goals: Progressing toward goals  Acute Rehab OT Goals Patient Stated Goal: get stronger/more independent OT Goal Formulation: With patient Time For Goal Achievement: 08/12/20 Potential to Achieve Goals: Good  Plan Discharge plan remains appropriate    Co-evaluation                 AM-PAC OT "6 Clicks" Daily Activity     Outcome Measure   Help from another person eating meals?: A Little Help from another person taking care of personal grooming?: A Lot Help from another person toileting, which includes using toliet, bedpan, or urinal?: A Lot Help from another person bathing (including washing, rinsing, drying)?: A Lot Help from another person to put on and taking off regular upper body clothing?: A Lot Help from another person to put on and taking off regular lower body clothing?: A Lot 6 Click Score: 13    End of Session    OT Visit Diagnosis: Other abnormalities of gait and mobility (R26.89);Muscle weakness (generalized)  (M62.81);Other symptoms and signs involving cognitive function   Activity Tolerance Patient limited by lethargy   Patient Left in bed;with call bell/phone within reach;with bed alarm set   Nurse Communication          Time: [id number] OT Time Calculation (min): 16 min  Charges: OT General Charges $OT Visit: 1 Visit OT Treatments $Self Care/Home Management : 8-22 mins  Gerrianne Scale, [place], OTR/L ascom [phone] 07/30/20, 5:44 PM

## 2020-07-30 NOTE — Progress Notes (Signed)
Shawn May  [id number] DOB: 1932/02/10 DOA: 07/28/2020 PCP: [facility]    Brief Narrative:  [phone] with a history of dementia, CVA, DM 2, CKD stage IV, HTN, and HLD who was brought to the ER by EMS with shortness of breath and severe generalized weakness. His family reported very poor oral intake for 2-3 days with increasing weakness. EMS found the patient with saturations in the 80s on room air.  Significant Events:  1/20 admit via ED 1/24 dialysis catheter removed  Date of Positive COVID Test:  1/20  Vaccination Status: Unknown  COVID-19 specific Treatment: Solu-Medrol 1/23 > 1/25  Antimicrobials:  Ceftriaxone 1/20 > 1/23 Keflex 1/24 >  DVT prophylaxis: Subcutaneous heparin  Subjective: Afebrile. Oxygen saturation 100% on room air. Vital signs otherwise stable. Renal function stabilizing/improved today. More alert today, but remains confused.   Assessment & Plan:  Severe sepsis POA - pansensitive E. coli UTI Has completed 7 days of antibiotic therapy - no indication of persisting infection  Acute kidney failure on CKD stage IV Baseline creatinine 2.46 -creatinine 6.83 at time of admission -followed by Nephrology at [facility] -required urgent dialysis for significant azotemia and hyperkalemia -no further need for dialysis at present - Nephrology following at [place]  Lab 07/26/20 0804 07/27/20 0731 07/28/20 0554 07/29/20 0553 07/30/20 0522  CREATININE 2.53* 2.45* 2.35* 2.83* 2.40*    Acute metabolic encephalopathy with baseline dementia Mental status waxing and waning severely - ?related to azotemia > discontinued steroid - follow - awaiting SNF placement  Hypernatremia Due to poor intake/free water deficit - encourage water intake - recheck in AM   Hyperkalemia corrected with dialysis and improved renal function  Hypokalemia Due to poor oral intake - very gently replace if drops below 3.0   CoViD infection CXR clear -  in  absence of hypoxia there is no indication for steroid and therefore this has been discontinued - monitor clinically but appears this is essentially asymptomatic- he is beyond the window to expect a 3 day course of Remdesivr to be helpful   DM2 with chronic kidney disease A1c 8.4 - CBG quite variable - steroid stopped 1/26 - adjust tx again today   Dysphagia  continued D1 diet with nectar thick liquids per SLP  Acute urinary retention Foley catheter present  Underweight - Body mass index is 18.55 kg/m.   Code Status: NO CODE BLUE Family Communication:  Status is: Inpatient  Remains inpatient appropriate because:Unsafe d/c plan   Dispo: The patient is from: Home              Anticipated d/c is to: SNF              Anticipated d/c date is: 1 day              Patient currently is not medically stable to d/c.   Difficult to place patient No   Consultants:  Nephrology  Objective: Blood pressure (!) 135/103, pulse 89, temperature (!) 96.8 F (36 C), resp. rate 18, height '5\' 8"'$  (1.727 m), weight 55.3 kg, SpO2 100 %.  Intake/Output Summary (Last 24 hours) at 07/30/2020 0840 Last data filed at 07/30/2020 0353 Gross per 24 hour  Intake -  Output 350 ml  Net -350 ml   Filed Weights   07/30/2020 1056 07/25/20 0105  Weight: 72.6 kg 55.3 kg    Examination: General: No acute respiratory distress Lungs: Clear to auscultation bilaterally  Cardiovascular: RRR  Abdomen:  NT/ND, soft, bs+, no mass  Extremities: No signif edema bilateral lower extremities  CBC: Recent Labs  Lab 07/26/20 0804 07/27/20 0731 07/28/20 0554  WBC 18.7* 19.8* 18.0*  HGB 11.1* 12.0* 11.7*  HCT 34.4* 36.4* 36.1*  MCV 82.5 83.3 83.8  PLT 490* 465* [id number]*   Basic Metabolic Panel: Recent Labs  Lab 07/26/20 0804 07/27/20 0731 07/28/20 0554 07/29/20 0553 07/30/20 0522  NA 147* 145 141 145 149*  K 3.3* 3.7 3.7 3.1* 3.3*  CL 106 106 104 110 110  CO2 '25 24 22 '$ 21* 25  GLUCOSE 345* 322* 376* 293* 212*   BUN 70* 72* 75* 88* 85*  CREATININE 2.53* 2.45* 2.35* 2.83* 2.40*  CALCIUM 8.7* 8.6* 8.7* 8.8* 9.0  MG 1.9 1.9 2.1  --   --   PHOS  --   --   --   --  2.8   GFR: Estimated Creatinine Clearance: 17.3 mL/min (A) (by C-G formula based on SCr of 2.4 mg/dL (H)).  Liver Function Tests: Recent Labs  Lab 07/29/20 0553 07/30/20 0522  AST 29  --   ALT 36  --   ALKPHOS 70  --   BILITOT 0.3  --   PROT 6.4*  --   ALBUMIN 2.7* 2.9*    HbA1C: Hgb A1c MFr Bld  Date/Time Value Ref Range Status  08/01/2020 03:26 PM 8.4 (H) 4.8 - 5.6 % Final    Comment:    (NOTE) Pre diabetes:          5.7%-6.4%  Diabetes:              >6.4%  Glycemic control for   <7.0% adults with diabetes   09/12/2018 05:40 AM 11.1 (H) 4.8 - 5.6 % Final    Comment:    (NOTE) Pre diabetes:          5.7%-6.4% Diabetes:              >6.4% Glycemic control for   <7.0% adults with diabetes     CBG: Recent Labs  Lab 07/29/20 1118 07/29/20 1217 07/29/20 1556 07/29/20 2027 07/30/20 0741  GLUCAP 308* 328* 196* 234* 235*    Recent Results (from the past 240 hour(s))  SARS Coronavirus 2 by RT PCR (hospital order, performed in [place] hospital lab) Nasopharyngeal Nasopharyngeal Swab     Status: Abnormal   Collection Time: 08/01/2020  1:15 PM   Specimen: Nasopharyngeal Swab  Result Value Ref Range Status   SARS Coronavirus 2 POSITIVE (A) NEGATIVE Final    Comment: RESULT CALLED TO, READ BACK BY AND VERIFIED WITH: ROBIN REGISTER AT 1453 ON 07/11/2020 BY SS (NOTE) SARS-CoV-2 target nucleic acids are DETECTED  SARS-CoV-2 RNA is generally detectable in upper respiratory specimens  during the acute phase of infection.  Positive results are indicative  of the presence of the identified virus, but do not rule out bacterial infection or co-infection with other pathogens not detected by the test.  Clinical correlation with patient history and  other diagnostic information is necessary to determine  patient infection status.  The expected result is negative.  Fact Sheet for Patients:   StrictlyIdeas.no   Fact Sheet for Healthcare Providers:   BankingDealers.co.za    This test is not yet approved or cleared by the [place] FDA and  has been authorized for detection and/or diagnosis of SARS-CoV-2 by FDA under an Emergency Use Authorization (EUA).  This EUA will remain in effect (meaning th is test can be used) for  the duration of  the COVID-19 declaration under Section 564(b)(1) of the Act, 21 U.S.C. section 360-bbb-3(b)(1), unless the authorization is terminated or revoked sooner.  Performed at [facility], [address] [place], [place] [address]   Culture, blood (single)     Status: None   Collection Time: 07/30/2020  2:17 PM   Specimen: BLOOD  Result Value Ref Range Status   Specimen Description BLOOD RIGHT ANTECUBITAL  Final   Special Requests   Final    BOTTLES DRAWN AEROBIC AND ANAEROBIC Blood Culture adequate volume   Culture   Final    NO GROWTH 5 DAYS Performed at [facility], [address] [place], [place] [address]    Report Status 07/27/2020 FINAL  Final  Urine Culture     Status: Abnormal   Collection Time: 07/07/2020  2:17 PM   Specimen: Urine, Random  Result Value Ref Range Status   Specimen Description   Final    URINE, RANDOM Performed at [facility], [address], [place], [place] [address]    Special Requests   Final    NONE Performed at [facility], [place], [place] [address]    Culture >=100,000 COLONIES/mL ESCHERICHIA COLI (A)  Final   Report Status 07/25/2020 FINAL  Final   Organism ID, Bacteria ESCHERICHIA COLI (A)  Final      Susceptibility   Escherichia coli - MIC*    AMPICILLIN <=2 SENSITIVE Sensitive     CEFAZOLIN <=4 SENSITIVE Sensitive     CEFEPIME <=0.12 SENSITIVE Sensitive     CEFTRIAXONE <=0.25 SENSITIVE Sensitive      CIPROFLOXACIN <=0.25 SENSITIVE Sensitive     GENTAMICIN <=1 SENSITIVE Sensitive     IMIPENEM <=0.25 SENSITIVE Sensitive     NITROFURANTOIN <=16 SENSITIVE Sensitive     TRIMETH/SULFA <=20 SENSITIVE Sensitive     AMPICILLIN/SULBACTAM <=2 SENSITIVE Sensitive     PIP/TAZO <=4 SENSITIVE Sensitive     * >=100,000 COLONIES/mL ESCHERICHIA COLI     Scheduled Meds: . Chlorhexidine Gluconate Cloth  6 each Topical Daily  . donepezil  5 mg Oral BID  . heparin  5,000 Units Subcutaneous Q8H  . insulin aspart  0-20 Units Subcutaneous TID WC  . insulin glargine  12 Units Subcutaneous Daily  . linagliptin  5 mg Oral Daily  . memantine  5 mg Oral BID     LOS: 8 days   Cherene Altes, MD [facility] Office  [phone] Pager - Text Page per Amion  If 7PM-7AM, please contact night-coverage per Amion 07/30/2020, 8:40 AM

## 2020-07-30 NOTE — Care Management Important Message (Signed)
Important Message  Patient Details  Name: GILMORE TSUDA MRN: [id number] Date of Birth: [birth year]   Medicare Important Message Given:  Yes  Left VM for spouse.   [place], LCSW 07/30/2020, 1:38 PM

## 2020-07-30 NOTE — Progress Notes (Addendum)
Patient is disoriented. Attempted call to patient's wife regarding Medicare IM and SNF recommendation. Left another VM requesting a return call.  Asked RN to ask patient's wife to call CSW if she visits at bedside today.  Oleh Genin, [place]

## 2020-07-30 NOTE — Progress Notes (Signed)
Physical Therapy Treatment Patient Details Name: Shawn May MRN: [id number] DOB: 12/19/1931 Today's Date: 07/30/2020    History of Present Illness Shawn May is an 89yoM who comes to [facility] on 1/20 c SOB, weakness, K+: 7.1. Pt positive COVID19. PMH: dementia, CVA, DM, CKD3, HTN. Per wife, ad baseline pt fully independent with intermittnent AMS due to dementia, otherwise mobile, no falls history.    PT Comments    Pt in bed upon entry, awake, interactive, but still clearly altered. Wife is visiting, washing Carel's face. Pt is fixated on getting up, getting out of bed. MaX to EOB, pt sits for several minutes for prep time, impulsive, not really following commands to remain at EOB, restrained only by his bradykinesia and heavy weakness. Pt offered a RW this date, struggles with recognizing hands as a unique and distinct feature of the RW, given hand over hand guidance for gripping, but ultimately unsuccessful. Once standing, pt mobilizes to counter again, as attempted previous days, then flexes trunk over, left hand on RW handle and Right elbow on counter. Pt given cues to stand tall, but he quickly fades and makes a bee-line for the bed and sits without warning, then tries to roll and crawl back in. Pt more awake this session than previous 2 days, but ultimately no improvement in gross motor capacity, nor in tolerance to basic activity. RN and wife present throughout. Pt left sitting up tall in bed, wife at bedside.   Follow Up Recommendations  SNF;Supervision for mobility/OOB;Supervision - Intermittent     Equipment Recommendations  None recommended by PT    Recommendations for Other Services       Precautions / Restrictions Precautions Precautions: Fall Restrictions Weight Bearing Restrictions: No    Mobility  Bed Mobility Overal bed mobility: Needs Assistance Bed Mobility: Supine to Sit;Sit to Supine     Supine to sit: Max assist Sit to supine: Max assist   General bed  mobility comments: able to sit with good balance at EOB  Transfers Overall transfer level: Needs assistance Equipment used: Rolling walker (2 wheeled) Transfers: Sit to/from Stand Sit to Stand: Min assist;From elevated surface         General transfer comment: requires total A for safe hand placement; heavily motivated to get up, get OOB; once up, he clearly has no wellconceived plan, just moving on impulse briefly before e leans with Rt elbow on countertopl cues to stand tall, but does not last long.  Ambulation/Gait                 Stairs             Wheelchair Mobility    Modified Rankin (Stroke Patients Only)       Balance Overall balance assessment: Modified Independent;Needs assistance Sitting-balance support: Single extremity supported Sitting balance-Leahy Scale: Fair Sitting balance - Comments: constant support needed   Standing balance support: Bilateral upper extremity supported Standing balance-Leahy Scale: Poor Standing balance comment: constant support needed                            Cognition Arousal/Alertness: Awake/alert Behavior During Therapy: Flat affect Overall Cognitive Status: History of cognitive impairments - at baseline                                 General Comments: follows simple commands intermittently <25% of time inititally, impulsive,  delayed, flat.      Exercises      General Comments        Pertinent Vitals/Pain      Home Living                      Prior Function            PT Goals (current goals can now be found in the care plan section) Acute Rehab PT Goals Patient Stated Goal: get stronger/more independent PT Goal Formulation: With family Time For Goal Achievement: 08/11/20 Potential to Achieve Goals: Poor Progress towards PT goals: Not progressing toward goals - comment    Frequency    Min 2X/week      PT Plan Current plan remains appropriate     Co-evaluation              AM-PAC PT "6 Clicks" Mobility   Outcome Measure  Help needed turning from your back to your side while in a flat bed without using bedrails?: A Lot Help needed moving from lying on your back to sitting on the side of a flat bed without using bedrails?: A Lot Help needed moving to and from a bed to a chair (including a wheelchair)?: Total Help needed standing up from a chair using your arms (e.g., wheelchair or bedside chair)?: Total Help needed to walk in hospital room?: Total Help needed climbing 3-5 steps with a railing? : Total 6 Click Score: 8    End of Session Equipment Utilized During Treatment: Gait belt Activity Tolerance: Patient limited by lethargy;Patient limited by fatigue Patient left: in bed;with call bell/phone within reach;with family/visitor present Nurse Communication: Mobility status PT Visit Diagnosis: Difficulty in walking, not elsewhere classified (R26.2);Other symptoms and signs involving the nervous system (R29.898)     Time: 1400-1416 PT Time Calculation (min) (ACUTE ONLY): 16 min  Charges:  $Therapeutic Exercise: 8-22 mins                     4:29 PM, 07/30/20 Etta Grandchild, PT, DPT Physical Therapist - [facility]  [phone] ([place])    [place] C 07/30/2020, 4:24 PM

## 2020-07-31 DIAGNOSIS — F0391 Unspecified dementia with behavioral disturbance: Secondary | ICD-10-CM | POA: Diagnosis not present

## 2020-07-31 DIAGNOSIS — A419 Sepsis, unspecified organism: Secondary | ICD-10-CM | POA: Diagnosis not present

## 2020-07-31 DIAGNOSIS — E875 Hyperkalemia: Secondary | ICD-10-CM | POA: Diagnosis not present

## 2020-07-31 DIAGNOSIS — N179 Acute kidney failure, unspecified: Secondary | ICD-10-CM | POA: Diagnosis not present

## 2020-07-31 LAB — GLUCOSE, CAPILLARY
Glucose-Capillary: 203 mg/dL — ABNORMAL HIGH (ref 70–99)
Glucose-Capillary: 203 mg/dL — ABNORMAL HIGH (ref 70–99)
Glucose-Capillary: 214 mg/dL — ABNORMAL HIGH (ref 70–99)
Glucose-Capillary: 49 mg/dL — ABNORMAL LOW (ref 70–99)
Glucose-Capillary: 95 mg/dL (ref 70–99)

## 2020-07-31 LAB — BASIC METABOLIC PANEL
Anion gap: 13 (ref 5–15)
BUN: 96 mg/dL — ABNORMAL HIGH (ref 8–23)
CO2: 23 mmol/L (ref 22–32)
Calcium: 9.2 mg/dL (ref 8.9–10.3)
Chloride: 112 mmol/L — ABNORMAL HIGH (ref 98–111)
Creatinine, Ser: 2.77 mg/dL — ABNORMAL HIGH (ref 0.61–1.24)
GFR, Estimated: 22 mL/min — ABNORMAL LOW (ref >60)
Glucose, Bld: 278 mg/dL — ABNORMAL HIGH (ref 70–99)
Potassium: 3.4 mmol/L — ABNORMAL LOW (ref 3.5–5.1)
Sodium: 148 mmol/L — ABNORMAL HIGH (ref 135–145)

## 2020-07-31 MED ORDER — INSULIN GLARGINE 100 UNIT/ML ~~LOC~~ SOLN
10.0000 [IU] | Freq: Every day | SUBCUTANEOUS | Status: DC
  Administered 2020-08-01: 10 [IU] via SUBCUTANEOUS
  Filled 2020-07-31 (×2): qty 0.1

## 2020-07-31 MED ORDER — DEXTROSE 50 % IV SOLN
12.5000 g | INTRAVENOUS | Status: AC
  Administered 2020-07-31: 12.5 g via INTRAVENOUS
  Filled 2020-07-31: qty 50

## 2020-07-31 NOTE — Progress Notes (Signed)
Pt with CBG result of 49. 1 Amp, D5 12.5 grams given. Pt skin dry. Pt temp 96.2 axillary. Blanket applied with room tempature increased, VSS otherwise stable.. Pt alert with orientation matching shift assessment.  Recheck of CBG result of 214. MD, Sheral Flow, notified. Lantus orders modified.v Continuing to monitor.

## 2020-07-31 NOTE — Progress Notes (Signed)
Shawn May  [id number] DOB: [birth year] DOA: 07/11/2020 PCP: [facility] [place]    Brief Narrative:  [phone] with a history of dementia, CVA, DM 2, CKD stage IV, HTN, and HLD who was brought to the ER by EMS with shortness of breath and severe generalized weakness. His family reported very poor oral intake for 2-3 days with increasing weakness. EMS found the patient with saturations in the 80s on room air.  Significant Events:  1/20 admit via ED 1/24 dialysis catheter removed  Date of Positive COVID Test:  1/20  Vaccination Status: Unknown  COVID-19 specific Treatment: Solu-Medrol 1/23 > 1/25  Antimicrobials:  Ceftriaxone 1/20 > 1/23 Keflex 1/24 >  DVT prophylaxis: Subcutaneous heparin  Subjective: Afebrile.  Vital signs stable.  Saturation 100% on room air.  Sodium slightly improved today.  Potassium stabilizing. Only minimally interactive today.   Assessment & Plan:  Severe sepsis POA - pansensitive E. coli UTI Has completed 7 days of antibiotic therapy - no indication of persisting infection  Acute kidney failure on CKD stage IV Baseline creatinine 2.46 -creatinine 6.83 at time of admission -[facility] at [facility] -required urgent dialysis for significant azotemia and hyperkalemia -no further need for dialysis at present - Nephrology following at [place] 07/27/20 0731 07/28/20 0554 07/29/20 0553 07/30/20 0522 07/31/20 0328  CREATININE 2.45* 2.35* 2.83* 2.40* 2.77*    Acute metabolic encephalopathy with baseline dementia Mental status waxing and waning severely - ?related to azotemia > discontinued steroid - follow - awaiting SNF placement  Hypernatremia Due to poor intake/free water deficit - encouraging water intake    Hyperkalemia corrected with dialysis and improved renal function  Hypokalemia Due to poor oral intake - very gently replace if drops below 3.0   CoViD infection CXR clear -  in absence of hypoxia  there is no indication for steroid and therefore this has been discontinued - monitor clinically but appears this is essentially asymptomatic- he is beyond the window to expect a 3 day course of Remdesivr to be helpful   DM2 with chronic kidney disease A1c 8.4 - CBG reasonably controlled at this time   Dysphagia  continued D1 diet with nectar thick liquids per SLP  Acute urinary retention Foley catheter present  Underweight - Body mass index is 18.55 kg/m.   Code Status: NO CODE BLUE Family Communication:  Status is: Inpatient  Remains inpatient appropriate because:Unsafe d/c plan   Dispo: The patient is from: Home              Anticipated d/c is to: SNF              Anticipated d/c date is: 1 day              Patient currently is not medically stable to d/c.   Difficult to place patient No   Consultants:  Nephrology  Objective: Blood pressure 116/71, pulse (!) 101, temperature 98.4 F (36.9 C), resp. rate 16, height '5\' 8"'$  (1.727 m), weight 55.3 kg, SpO2 100 %. No intake or output data in the 24 hours ending 07/31/20 0839 Filed Weights   07/23/2020 1056 07/25/20 0105  Weight: 72.6 kg 55.3 kg    Examination: General: No acute respiratory distress Lungs: Clear to auscultation B Cardiovascular: RRR  Abdomen: NT/ND, soft, bs+, no mass  Extremities: No signif edema B LE   CBC: Recent Labs  Lab 07/26/20 0804 07/27/20 0731 07/28/20 0554  WBC 18.7* 19.8*  18.0*  HGB 11.1* 12.0* 11.7*  HCT 34.4* 36.4* 36.1*  MCV 82.5 83.3 83.8  PLT 490* 465* 123XX123*   Basic Metabolic Panel: Recent Labs  Lab 07/26/20 0804 07/27/20 0731 07/28/20 0554 07/29/20 0553 07/30/20 0522 07/31/20 0328  NA 147* 145 141 145 149* 148*  K 3.3* 3.7 3.7 3.1* 3.3* 3.4*  CL 106 106 104 110 110 112*  CO2 '25 24 22 '$ 21* 25 23  GLUCOSE 345* 322* 376* 293* 212* 278*  BUN 70* 72* 75* 88* 85* 96*  CREATININE 2.53* 2.45* 2.35* 2.83* 2.40* 2.77*  CALCIUM 8.7* 8.6* 8.7* 8.8* 9.0 9.2  MG 1.9 1.9 2.1  --    --   --   PHOS  --   --   --   --  2.8  --    GFR: Estimated Creatinine Clearance: 15 mL/min (A) (by C-G formula based on SCr of 2.77 mg/dL (H)).  Liver Function Tests: Recent Labs  Lab 07/29/20 0553 07/30/20 0522  AST 29  --   ALT 36  --   ALKPHOS 70  --   BILITOT 0.3  --   PROT 6.4*  --   ALBUMIN 2.7* 2.9*    HbA1C: Hgb A1c MFr Bld  Date/Time Value Ref Range Status  07/12/2020 03:26 PM 8.4 (H) 4.8 - 5.6 % Final    Comment:    (NOTE) Pre diabetes:          5.7%-6.4%  Diabetes:              >6.4%  Glycemic control for   <7.0% adults with diabetes   09/12/2018 05:40 AM 11.1 (H) 4.8 - 5.6 % Final    Comment:    (NOTE) Pre diabetes:          5.7%-6.4% Diabetes:              >6.4% Glycemic control for   <7.0% adults with diabetes     CBG: Recent Labs  Lab 07/29/20 2027 07/30/20 0741 07/30/20 1215 07/30/20 1608 07/30/20 2052  GLUCAP 234* 235* 142* 144* 261*    Recent Results (from the past 240 hour(s))  SARS Coronavirus 2 by RT PCR (hospital order, performed in [place] hospital lab) Nasopharyngeal Nasopharyngeal Swab     Status: Abnormal   Collection Time: 07/19/2020  1:15 PM   Specimen: Nasopharyngeal Swab  Result Value Ref Range Status   SARS Coronavirus 2 POSITIVE (A) NEGATIVE Final    Comment: RESULT CALLED TO, READ BACK BY AND VERIFIED WITH: ROBIN REGISTER AT 1453 ON 07/05/2020 BY SS (NOTE) SARS-CoV-2 target nucleic acids are DETECTED  SARS-CoV-2 RNA is generally detectable in upper respiratory specimens  during the acute phase of infection.  Positive results are indicative  of the presence of the identified virus, but do not rule out bacterial infection or co-infection with other pathogens not detected by the test.  Clinical correlation with patient history and  other diagnostic information is necessary to determine patient infection status.  The expected result is negative.  Fact Sheet for Patients:    StrictlyIdeas.no   Fact Sheet for Healthcare Providers:   BankingDealers.co.za    This test is not yet approved or cleared by the [place] FDA and  has been authorized for detection and/or diagnosis of SARS-CoV-2 by FDA under an Emergency Use Authorization (EUA).  This EUA will remain in effect (meaning th is test can be used) for the duration of  the COVID-19 declaration under Section 564(b)(1) of the Act,  21 U.S.C. section 360-bbb-3(b)(1), unless the authorization is terminated or revoked sooner.  Performed at [facility], [address] [place], [place] [address]   Culture, blood (single)     Status: None   Collection Time: 07/21/2020  2:17 PM   Specimen: BLOOD  Result Value Ref Range Status   Specimen Description BLOOD RIGHT ANTECUBITAL  Final   Special Requests   Final    BOTTLES DRAWN AEROBIC AND ANAEROBIC Blood Culture adequate volume   Culture   Final    NO GROWTH 5 DAYS Performed at [facility], [address] [place], [place] [address]    Report Status 07/27/2020 FINAL  Final  Urine Culture     Status: Abnormal   Collection Time: 07/13/2020  2:17 PM   Specimen: Urine, Random  Result Value Ref Range Status   Specimen Description   Final    URINE, RANDOM Performed at [facility], [address] [place], [place] [address]    Special Requests   Final    NONE Performed at [facility], [place], [place] [address]    Culture >=100,000 COLONIES/mL ESCHERICHIA COLI (A)  Final   Report Status 07/25/2020 FINAL  Final   Organism ID, Bacteria ESCHERICHIA COLI (A)  Final      Susceptibility   Escherichia coli - MIC*    AMPICILLIN <=2 SENSITIVE Sensitive     CEFAZOLIN <=4 SENSITIVE Sensitive     CEFEPIME <=0.12 SENSITIVE Sensitive     CEFTRIAXONE <=0.25 SENSITIVE Sensitive     CIPROFLOXACIN <=0.25 SENSITIVE Sensitive     GENTAMICIN <=1 SENSITIVE Sensitive      IMIPENEM <=0.25 SENSITIVE Sensitive     NITROFURANTOIN <=16 SENSITIVE Sensitive     TRIMETH/SULFA <=20 SENSITIVE Sensitive     AMPICILLIN/SULBACTAM <=2 SENSITIVE Sensitive     PIP/TAZO <=4 SENSITIVE Sensitive     * >=100,000 COLONIES/mL ESCHERICHIA COLI     Scheduled Meds: . Chlorhexidine Gluconate Cloth  6 each Topical Daily  . donepezil  5 mg Oral BID  . heparin  5,000 Units Subcutaneous Q8H  . insulin aspart  0-20 Units Subcutaneous TID WC  . insulin glargine  16 Units Subcutaneous Daily  . linagliptin  5 mg Oral Daily  . memantine  5 mg Oral BID  . senna-docusate  1 tablet Oral BID     LOS: 9 days   Cherene Altes, MD [facility] Office  [phone] Pager - Text Page per Amion  If 7PM-7AM, please contact night-coverage per Amion 07/31/2020, 8:39 AM

## 2020-07-31 NOTE — Progress Notes (Addendum)
Shawn May  MRN: [id number]  DOB/AGE: [birth year] 85 y.o.  Primary Care Physician:[facility] [place] date: 07/16/2020  Chief Complaint:  Chief Complaint  Patient presents with  . Shortness of Breath  . Weakness    S-Pt presented on  08/01/2020 with  Chief Complaint  Patient presents with  . Shortness of Breath  . Weakness  . Patient offers no new specific physical concerns  Medications   . Chlorhexidine Gluconate Cloth  6 each Topical Daily  . donepezil  5 mg Oral BID  . heparin  5,000 Units Subcutaneous Q8H  . insulin aspart  0-20 Units Subcutaneous TID WC  . insulin glargine  16 Units Subcutaneous Daily  . linagliptin  5 mg Oral Daily  . memantine  5 mg Oral BID  . senna-docusate  1 tablet Oral BID         [id number] from the symptoms mentioned above,there are no other symptoms referable to all systems reviewed.  Physical Exam: Vital signs in last 24 hours: Temp:  [97.4 F (36.3 C)-98.4 F (36.9 C)] 98.4 F (36.9 C) (01/29 0557) Pulse Rate:  [101-108] 101 (01/29 0557) Resp:  [15-18] 16 (01/29 0557) BP: (111-129)/(69-97) 116/71 (01/29 0557) SpO2:  [99 %-100 %] 100 % (01/29 0557) Weight change:  Last BM Date:  (unknown)  Intake/Output from previous day: No intake/output data recorded. No intake/output data recorded.   Physical Exam:  General- pt is awake,alert, oriented to time place and person  Resp- No acute REsp distress, CTA B/L NO Rhonchi  CVS- S1S2 regular in rate and rhythm  GIT- BS+, soft, Non tender , Non distended  EXT- No LE Edema,  No Cyanosis   Lab Results:  HGb 11.7   BMET  Recent Labs    07/30/20 0522 07/31/20 0328  NA 149* 148*  K 3.3* 3.4*  CL 110 112*  CO2 25 23  GLUCOSE 212* 278*  BUN 85* 96*  CREATININE 2.40* 2.77*  CALCIUM 9.0 9.2      Most recent Creatinine trend  Lab Results  Component Value Date   CREATININE 2.77 (H) 07/31/2020   CREATININE 2.40 (H) 07/30/2020   CREATININE  2.83 (H) 07/29/2020      MICRO   Recent Results (from the past 240 hour(s))  SARS Coronavirus 2 by RT PCR (hospital order, performed in [facility] hospital lab) Nasopharyngeal Nasopharyngeal Swab     Status: Abnormal   Collection Time: 07/03/2020  1:15 PM   Specimen: Nasopharyngeal Swab  Result Value Ref Range Status   SARS Coronavirus 2 POSITIVE (A) NEGATIVE Final    Comment: RESULT CALLED TO, READ BACK BY AND VERIFIED WITH: ROBIN REGISTER AT 1453 ON 08/01/2020 BY SS (NOTE) SARS-CoV-2 target nucleic acids are DETECTED  SARS-CoV-2 RNA is generally detectable in upper respiratory specimens  during the acute phase of infection.  Positive results are indicative  of the presence of the identified virus, but do not rule out bacterial infection or co-infection with other pathogens not detected by the test.  Clinical correlation with patient history and  other diagnostic information is necessary to determine patient infection status.  The expected result is negative.  Fact Sheet for Patients:   StrictlyIdeas.no   Fact Sheet for Healthcare Providers:   BankingDealers.co.za    This test is not yet approved or cleared by the [place] FDA and  has been authorized for detection and/or diagnosis of SARS-CoV-2 by FDA under an Emergency Use Authorization (EUA).  This EUA will  remain in effect (meaning th is test can be used) for the duration of  the COVID-19 declaration under Section 564(b)(1) of the Act, 21 U.S.C. section 360-bbb-3(b)(1), unless the authorization is terminated or revoked sooner.  Performed at [facility], [address] [place], [place] [address]   Culture, blood (single)     Status: None   Collection Time: 07/23/2020  2:17 PM   Specimen: BLOOD  Result Value Ref Range Status   Specimen Description BLOOD RIGHT ANTECUBITAL  Final   Special Requests   Final    BOTTLES DRAWN AEROBIC AND ANAEROBIC Blood Culture  adequate volume   Culture   Final    NO GROWTH 5 DAYS Performed at [facility], [address] [place], [place] [address]    Report Status 07/27/2020 FINAL  Final  Urine Culture     Status: Abnormal   Collection Time: 07/23/2020  2:17 PM   Specimen: Urine, Random  Result Value Ref Range Status   Specimen Description   Final    URINE, RANDOM Performed at [facility], [address] [place], [place] [address]    Special Requests   Final    NONE Performed at [facility], [address], [place], [place] [address]    Culture >=100,000 COLONIES/mL ESCHERICHIA COLI (A)  Final   Report Status 07/25/2020 FINAL  Final   Organism ID, Bacteria ESCHERICHIA COLI (A)  Final      Susceptibility   Escherichia coli - MIC*    AMPICILLIN <=2 SENSITIVE Sensitive     CEFAZOLIN <=4 SENSITIVE Sensitive     CEFEPIME <=0.12 SENSITIVE Sensitive     CEFTRIAXONE <=0.25 SENSITIVE Sensitive     CIPROFLOXACIN <=0.25 SENSITIVE Sensitive     GENTAMICIN <=1 SENSITIVE Sensitive     IMIPENEM <=0.25 SENSITIVE Sensitive     NITROFURANTOIN <=16 SENSITIVE Sensitive     TRIMETH/SULFA <=20 SENSITIVE Sensitive     AMPICILLIN/SULBACTAM <=2 SENSITIVE Sensitive     PIP/TAZO <=4 SENSITIVE Sensitive     * >=100,000 COLONIES/mL ESCHERICHIA COLI         Impression:  Shawn May is a 87 y.o. black male with dementia, diabetes mellitus type 2, hyperlipidemia, hypertension, who was admitted to [facility] on1/20/2022for evaluation of shortness of breath and weakness.   1)Renal    AKI secondary to ATN AKI on CKD Patient follows up with [facility] nephrology as an outpatient Patient did require emergent dialysis on admission as patient had BUN of 167, creatinine of 6.8 and potassium of 7.1 Patient creatinine is now stable No acute indication of renal placement therapy Patient has CKD stage IV Patient is CKD stage IV  going back to 2019 when patient creatinine was around  2.8   2)HTN    Blood pressure is stable    3)Anemia of chronic disease  CBC Latest Ref Rng & Units 07/28/2020 07/27/2020 07/26/2020  WBC 4.0 - 10.5 K/uL 18.0(H) 19.8(H) 18.7(H)  Hemoglobin 13.0 - 17.0 g/dL 11.7(L) 12.0(L) 11.1(L)  Hematocrit 39.0 - 52.0 % 36.1(L) 36.4(L) 34.4(L)  Platelets 150 - 400 K/uL 475(H) 465(H) 490(H)       HGb at goal (9--11) NO need of epo   4) Secondary hyperparathyroidism -CKD Mineral-Bone Disorder    Lab Results  Component Value Date   CALCIUM 9.2 07/31/2020   PHOS 2.8 07/30/2020    Secondary Hyperparathyroidism present Patient intact PTH levels have been high at 130 as an outpatient Phosphorus at goal.   5) COVID-19  Primary team is following  6) Electrolytes   BMP Latest Ref Rng & Units 07/31/2020 07/30/2020 07/29/2020  Glucose 70 - 99 mg/dL 278(H) 212(H) 293(H)  BUN 8 - 23 mg/dL 96(H) 85(H) 88(H)  Creatinine 0.61 - 1.24 mg/dL 2.77(H) 2.40(H) 2.83(H)  Sodium 135 - 145 mmol/L 148(H) 149(H) 145  Potassium 3.5 - 5.1 mmol/L 3.4(L) 3.3(L) 3.1(L)  Chloride 98 - 111 mmol/L 112(H) 110 110  CO2 22 - 32 mmol/L 23 25 21(L)  Calcium 8.9 - 10.3 mg/dL 9.2 9.0 8.8(L)     Sodium Hypernatremia This is secondary to inadequate free water intake   Potassium Hypokalemia Secondary to inadequate p.o. intake We will follow    7)Acid base Co2 at goal     Plan:  Will encourage free water intake We will replete potassium if trends lower, it is improving slowly from 3.1to  now 3.4 No acute need of renal placement therapy      Shawn May s Shawn May 07/31/2020, 9:05 AM

## 2020-08-01 DIAGNOSIS — F0391 Unspecified dementia with behavioral disturbance: Secondary | ICD-10-CM | POA: Diagnosis not present

## 2020-08-01 DIAGNOSIS — N390 Urinary tract infection, site not specified: Secondary | ICD-10-CM | POA: Diagnosis not present

## 2020-08-01 DIAGNOSIS — A419 Sepsis, unspecified organism: Secondary | ICD-10-CM | POA: Diagnosis not present

## 2020-08-01 DIAGNOSIS — N179 Acute kidney failure, unspecified: Secondary | ICD-10-CM | POA: Diagnosis not present

## 2020-08-01 LAB — BASIC METABOLIC PANEL
Anion gap: 16 — ABNORMAL HIGH (ref 5–15)
BUN: 88 mg/dL — ABNORMAL HIGH (ref 8–23)
CO2: 22 mmol/L (ref 22–32)
Calcium: 8.9 mg/dL (ref 8.9–10.3)
Chloride: 111 mmol/L (ref 98–111)
Creatinine, Ser: 2.27 mg/dL — ABNORMAL HIGH (ref 0.61–1.24)
GFR, Estimated: 27 mL/min — ABNORMAL LOW (ref >60)
Glucose, Bld: 233 mg/dL — ABNORMAL HIGH (ref 70–99)
Potassium: 3.5 mmol/L (ref 3.5–5.1)
Sodium: 149 mmol/L — ABNORMAL HIGH (ref 135–145)

## 2020-08-01 LAB — GLUCOSE, CAPILLARY
Glucose-Capillary: 133 mg/dL — ABNORMAL HIGH (ref 70–99)
Glucose-Capillary: 272 mg/dL — ABNORMAL HIGH (ref 70–99)
Glucose-Capillary: 161 mg/dL — ABNORMAL HIGH (ref 70–99)
Glucose-Capillary: 28 mg/dL — CL (ref 70–99)
Glucose-Capillary: 146 mg/dL — ABNORMAL HIGH (ref 70–99)

## 2020-08-01 LAB — PHOSPHORUS: Phosphorus: 3.3 mg/dL (ref 2.5–4.6)

## 2020-08-01 MED ORDER — MORPHINE SULFATE (PF) 2 MG/ML IV SOLN
0.5000 mg | INTRAVENOUS | Status: DC | PRN
  Administered 2020-08-01 – 2020-08-02 (×2): 1 mg via INTRAVENOUS
  Filled 2020-08-01 (×2): qty 1

## 2020-08-01 MED ORDER — GLUCOSE 40 % PO GEL
ORAL | Status: AC
  Filled 2020-08-01: qty 1

## 2020-08-01 MED ORDER — GLYCOPYRROLATE 1 MG PO TABS
1.0000 mg | ORAL_TABLET | Freq: Three times a day (TID) | ORAL | Status: DC | PRN
Start: 2020-08-01 — End: 2020-08-01
  Filled 2020-08-01: qty 1

## 2020-08-01 MED ORDER — DEXTROSE-NACL 5-0.45 % IV SOLN: INTRAVENOUS | Status: DC

## 2020-08-01 MED ORDER — MORPHINE SULFATE (PF) 2 MG/ML IV SOLN
INTRAVENOUS | Status: AC
  Administered 2020-08-01: 1 mg via INTRAVENOUS
  Filled 2020-08-01: qty 1

## 2020-08-01 MED ORDER — GLYCOPYRROLATE 0.2 MG/ML IJ SOLN
0.1000 mg | Freq: Four times a day (QID) | INTRAMUSCULAR | Status: DC | PRN
  Administered 2020-08-01 – 2020-08-03 (×6): 0.1 mg via INTRAVENOUS
  Filled 2020-08-01 (×3): qty 1
  Filled 2020-08-01 (×2): qty 0.5
  Filled 2020-08-01: qty 1
  Filled 2020-08-01: qty 0.5
  Filled 2020-08-01: qty 1

## 2020-08-01 MED ORDER — DEXTROSE 50 % IV SOLN
INTRAVENOUS | Status: AC
  Filled 2020-08-01: qty 50

## 2020-08-01 NOTE — Progress Notes (Signed)
Pt noted to have blood sugar of 28. Gave 1/2 amp dextrose before PIV infiltrated. Then gave oral dextrose sublingual. Pt quickly became arousable with blood glucose WNL. MD made aware.

## 2020-08-01 NOTE — Progress Notes (Addendum)
Shawn May  MRN: [id number]  DOB/AGE: [birth year] 85 y.o.  Primary Care Physician:[facility] [place] date: 07/26/2020  Chief Complaint:  Chief Complaint  Patient presents with  . Shortness of Breath  . Weakness    S-Pt presented on  07/28/2020 with  Chief Complaint  Patient presents with  . Shortness of Breath  . Weakness  . Patient offers no new specific physical concerns  Medications   . donepezil  5 mg Oral BID  . heparin  5,000 Units Subcutaneous Q8H  . insulin aspart  0-20 Units Subcutaneous TID WC  . memantine  5 mg Oral BID  . senna-docusate  1 tablet Oral BID         [id number] from the symptoms mentioned above,there are no other symptoms referable to all systems reviewed.  Physical Exam: Vital signs in last 24 hours: Temp:  [96.2 F (35.7 C)-97.6 F (36.4 C)] 97.4 F (36.3 C) (01/30 1742) Pulse Rate:  [86-98] 87 (01/30 1742) Resp:  [14-20] 14 (01/30 1742) BP: (125-143)/(82-89) 135/83 (01/30 1742) SpO2:  [97 %-100 %] 97 % (01/30 1742) Weight change:  Last BM Date:  (unknown)  Intake/Output from previous day: 01/29 0701 - 01/30 0700 In: -  Out: 300 [Urine:300] Total I/O In: 240 [P.O.:240] Out: -    Physical Exam:  General- pt is awake,alert, oriented to time place and person  Resp- No acute REsp distress, CTA B/L NO Rhonchi  CVS- S1S2 regular in rate and rhythm  GIT- BS+, soft, Non tender , Non distended  EXT- No LE Edema,  No Cyanosis    Lab Results:  HGb 11.7   BMET  Recent Labs    07/31/20 0328 08/01/20 0948  NA 148* 149*  K 3.4* 3.5  CL 112* 111  CO2 23 22  GLUCOSE 278* 233*  BUN 96* 88*  CREATININE 2.77* 2.27*  CALCIUM 9.2 8.9      Most recent Creatinine trend  Lab Results  Component Value Date   CREATININE 2.27 (H) 08/01/2020   CREATININE 2.77 (H) 07/31/2020   CREATININE 2.40 (H) 07/30/2020      MICRO   No results found for this or any previous visit (from the past 240  hour(s)).       Impression:  Shawn May is a 87 y.o. black male with dementia, diabetes mellitus type 2, hyperlipidemia, hypertension, who was [facility] to [facility] on1[facility]20/2022for evaluation of shortness of breath and weakness.   1)Renal    AKI secondary to ATN AKI on CKD Patient follows up with [facility] nephrology as an outpatient Patient did require emergent dialysis on admission as patient had BUN of 167, creatinine of 6.8 and potassium of 7.1 Patient creatinine is now stable No acute indication of renal placement therapy Patient has CKD stage IV Patient is CKD stage IV  going back to 2019 when patient creatinine was around 2.8   Patient's creatinine is at his baseline of 2.4--2.8 We will continue to follow closely Depending upon the trend we will decide about need of renal placement therapy  2)HTN    Blood pressure is stable    3)Anemia of chronic disease  CBC Latest Ref Rng & Units 07/28/2020 07/27/2020 07/26/2020  WBC 4.0 - 10.5 K/uL 18.0(H) 19.8(H) 18.7(H)  Hemoglobin 13.0 - 17.0 g/dL 11.7(L) 12.0(L) 11.1(L)  Hematocrit 39.0 - 52.0 % 36.1(L) 36.4(L) 34.4(L)  Platelets 150 - 400 K/uL 475(H) 465(H) 490(H)       HGb at goal (9--11) NO  need of epo   4) Secondary hyperparathyroidism -CKD Mineral-Bone Disorder    Lab Results  Component Value Date   CALCIUM 8.9 08/01/2020   PHOS 3.3 08/01/2020    Secondary Hyperparathyroidism present Patient intact PTH levels have been high at 130 as an outpatient Phosphorus at goal.   5) COVID-19 Primary team is following  6) Electrolytes   BMP Latest Ref Rng & Units 08/01/2020 07/31/2020 07/30/2020  Glucose 70 - 99 mg/dL 233(H) 278(H) 212(H)  BUN 8 - 23 mg/dL 88(H) 96(H) 85(H)  Creatinine 0.61 - 1.24 mg/dL 2.27(H) 2.77(H) 2.40(H)  Sodium 135 - 145 mmol/L 149(H) 148(H) 149(H)  Potassium 3.5 - 5.1 mmol/L 3.5 3.4(L) 3.3(L)  Chloride 98 - 111 mmol/L 111 112(H) 110  CO2 22 - 32 mmol/L '22 23 25  '$ Calcium 8.9 - 10.3  mg/dL 8.9 9.2 9.0     Sodium Hypernatremia This is secondary to inadequate free water intake Sodium is trending down slowly  Potassium Hypokalemia Secondary to inadequate p.o. intake We will follow    7)Acid base Co2 at goal     Plan:  Will encourage free water intake Will follow BMET  No acute need of renal placement therapy      Matti Killingsworth s Tira Lafferty 08/01/2020, 5:56 PM

## 2020-08-01 NOTE — Progress Notes (Signed)
Shawn May  [id number] DOB: May 27, 1932 DOA: 07/12/2020 PCP: [facility] [place]    Brief Narrative:  [phone] with a history of dementia, CVA, DM 2, CKD stage IV, HTN, and HLD who was brought to the ER by EMS with shortness of breath and severe generalized weakness. His family reported very poor oral intake for 2-3 days with increasing weakness. EMS found the patient with saturations in the 80s on room air.  Significant Events:  1/20 admit via ED 1/24 dialysis catheter removed  Date of Positive COVID Test:  07/08/2020  Date CoViD Isolation Ends: 08/02/20 (i.e. 1/30 is last full day of isolation)  Vaccination Status: Unknown  COVID-19 specific Treatment: Solu-Medrol 1/23 > 1/25  Antimicrobials:  Ceftriaxone 1/20 > 1/23 Keflex 1/24 >  DVT prophylaxis: Subcutaneous heparin  Subjective: Vital signs stable.  Afebrile.  Suffered a transient hypoglycemic event yesterday afternoon. No acute distress today.   Assessment & Plan:  Severe sepsis POA - pansensitive E. coli UTI Has completed 7 days of antibiotic therapy - no indication of persisting infection  Acute kidney failure on CKD stage IV Baseline creatinine 2.46 -creatinine 6.83 at time of admission -followed by Nephrology at [facility] -required urgent dialysis for significant azotemia and hyperkalemia - no further need for dialysis at present - Nephrology following at [place] 07/27/20 0731 07/28/20 0554 07/29/20 0553 07/30/20 0522 07/31/20 0328  CREATININE 2.45* 2.35* 2.83* 2.40* 2.77*    Acute metabolic encephalopathy with baseline dementia Mental status waxing and waning - ?related to azotemia > discontinued steroid - follow - awaiting SNF placement  Hypernatremia Due to poor intake/free water deficit - encouraging water intake    Hyperkalemia corrected with dialysis and improved renal function  Hypokalemia Due to poor oral intake - very gently replace if drops below 3.0   CoViD  infection CXR clear -  in absence of hypoxia there is no indication for steroid and therefore this has been discontinued - monitor clinically but appears this is essentially asymptomatic  DM2 with chronic kidney disease A1c 8.4 -adjusting insulin therapy with episodic hypoglycemia yesterday  Dysphagia  continued D1 diet with nectar thick liquids per SLP  Acute urinary retention Foley catheter present  Underweight - Body mass index is 18.55 kg/m.   Code Status: NO CODE BLUE Family Communication:  Status is: Inpatient  Remains inpatient appropriate because:Unsafe d/c plan   Dispo: The patient is from: Home              Anticipated d/c is to: SNF              Anticipated d/c date is: 1 day              Patient currently is not medically stable to d/c.   Difficult to place patient No   Consultants:  Nephrology  Objective: Blood pressure 125/82, pulse 91, temperature (!) 97.5 F (36.4 C), temperature source Axillary, resp. rate 18, height '5\' 8"'$  (1.727 m), weight 55.3 kg, SpO2 98 %.  Intake/Output Summary (Last 24 hours) at 08/01/2020 0906 Last data filed at 08/01/2020 0700 Gross per 24 hour  Intake -  Output 300 ml  Net -300 ml   Filed Weights   07/23/2020 1056 07/25/20 0105  Weight: 72.6 kg 55.3 kg    Examination: General: No acute respiratory distress Lungs: CTA B - no wheezing  Cardiovascular: RRR  Abdomen: NT/ND, soft, bs+, no mass  Extremities: No edema B LE   CBC:  Recent Labs  Lab 07/26/20 0804 07/27/20 0731 07/28/20 0554  WBC 18.7* 19.8* 18.0*  HGB 11.1* 12.0* 11.7*  HCT 34.4* 36.4* 36.1*  MCV 82.5 83.3 83.8  PLT 490* 465* 123XX123*   Basic Metabolic Panel: Recent Labs  Lab 07/26/20 0804 07/27/20 0731 07/28/20 0554 07/29/20 0553 07/30/20 0522 07/31/20 0328  NA 147* 145 141 145 149* 148*  K 3.3* 3.7 3.7 3.1* 3.3* 3.4*  CL 106 106 104 110 110 112*  CO2 '25 24 22 '$ 21* 25 23  GLUCOSE 345* 322* 376* 293* 212* 278*  BUN 70* 72* 75* 88* 85* 96*   CREATININE 2.53* 2.45* 2.35* 2.83* 2.40* 2.77*  CALCIUM 8.7* 8.6* 8.7* 8.8* 9.0 9.2  MG 1.9 1.9 2.1  --   --   --   PHOS  --   --   --   --  2.8  --    GFR: Estimated Creatinine Clearance: 15 mL/min (A) (by C-G formula based on SCr of 2.77 mg/dL (H)).  Liver Function Tests: Recent Labs  Lab 07/29/20 0553 07/30/20 0522  AST 29  --   ALT 36  --   ALKPHOS 70  --   BILITOT 0.3  --   PROT 6.4*  --   ALBUMIN 2.7* 2.9*    HbA1C: Hgb A1c MFr Bld  Date/Time Value Ref Range Status  07/11/2020 03:26 PM 8.4 (H) 4.8 - 5.6 % Final    Comment:    (NOTE) Pre diabetes:          5.7%-6.4%  Diabetes:              >6.4%  Glycemic control for   <7.0% adults with diabetes   09/12/2018 05:40 AM 11.1 (H) 4.8 - 5.6 % Final    Comment:    (NOTE) Pre diabetes:          5.7%-6.4% Diabetes:              >6.4% Glycemic control for   <7.0% adults with diabetes     CBG: Recent Labs  Lab 07/31/20 1325 07/31/20 1759 07/31/20 1822 07/31/20 2018 08/01/20 0808  GLUCAP 203* 49* 214* 95 133*    Recent Results (from the past 240 hour(s))  SARS Coronavirus 2 by RT PCR (hospital order, performed in [place] hospital lab) Nasopharyngeal Nasopharyngeal Swab     Status: Abnormal   Collection Time: 07/08/2020  1:15 PM   Specimen: Nasopharyngeal Swab  Result Value Ref Range Status   SARS Coronavirus 2 POSITIVE (A) NEGATIVE Final    Comment: RESULT CALLED TO, READ BACK BY AND VERIFIED WITH: ROBIN REGISTER AT 1453 ON 07/07/2020 BY SS (NOTE) SARS-CoV-2 target nucleic acids are DETECTED  SARS-CoV-2 RNA is generally detectable in upper respiratory specimens  during the acute phase of infection.  Positive results are indicative  of the presence of the identified virus, but do not rule out bacterial infection or co-infection with other pathogens not detected by the test.  Clinical correlation with patient history and  other diagnostic information is necessary to determine patient infection status.   The expected result is negative.  Fact Sheet for Patients:   StrictlyIdeas.no   Fact Sheet for Healthcare Providers:   BankingDealers.co.za    This test is not yet approved or cleared by the [place] FDA and  has been authorized for detection and/or diagnosis of SARS-CoV-2 by FDA under an Emergency Use Authorization (EUA).  This EUA will remain in effect (meaning th is test can be used)  for the duration of  the COVID-19 declaration under Section 564(b)(1) of the Act, 21 U.S.C. section 360-bbb-3(b)(1), unless the authorization is terminated or revoked sooner.  Performed at [facility], [address] [place], [place] [address]   Culture, blood (single)     Status: None   Collection Time: 07/18/2020  2:17 PM   Specimen: BLOOD  Result Value Ref Range Status   Specimen Description BLOOD RIGHT ANTECUBITAL  Final   Special Requests   Final    BOTTLES DRAWN AEROBIC AND ANAEROBIC Blood Culture adequate volume   Culture   Final    NO GROWTH 5 DAYS Performed at [facility], [address] [place], [place] [address]    Report Status 07/27/2020 FINAL  Final  Urine Culture     Status: Abnormal   Collection Time: 07/15/2020  2:17 PM   Specimen: Urine, Random  Result Value Ref Range Status   Specimen Description   Final    URINE, RANDOM Performed at [facility], [address] [place], [place] [address]    Special Requests   Final    NONE Performed at [facility], [place], [place] [address]    Culture >=100,000 COLONIES/mL ESCHERICHIA COLI (A)  Final   Report Status 07/25/2020 FINAL  Final   Organism ID, Bacteria ESCHERICHIA COLI (A)  Final      Susceptibility   Escherichia coli - MIC*    AMPICILLIN <=2 SENSITIVE Sensitive     CEFAZOLIN <=4 SENSITIVE Sensitive     CEFEPIME <=0.12 SENSITIVE Sensitive     CEFTRIAXONE <=0.25 SENSITIVE Sensitive     CIPROFLOXACIN <=0.25  SENSITIVE Sensitive     GENTAMICIN <=1 SENSITIVE Sensitive     IMIPENEM <=0.25 SENSITIVE Sensitive     NITROFURANTOIN <=16 SENSITIVE Sensitive     TRIMETH/SULFA <=20 SENSITIVE Sensitive     AMPICILLIN/SULBACTAM <=2 SENSITIVE Sensitive     PIP/TAZO <=4 SENSITIVE Sensitive     * >=100,000 COLONIES/mL ESCHERICHIA COLI     Scheduled Meds: . donepezil  5 mg Oral BID  . heparin  5,000 Units Subcutaneous Q8H  . insulin aspart  0-20 Units Subcutaneous TID WC  . insulin glargine  10 Units Subcutaneous Daily  . linagliptin  5 mg Oral Daily  . memantine  5 mg Oral BID  . senna-docusate  1 tablet Oral BID     LOS: 10 days   Cherene Altes, MD [facility] Office  [phone] Pager - Text Page per Amion  If 7PM-7AM, please contact night-coverage per Amion 08/01/2020, 9:06 AM

## 2020-08-01 NOTE — Progress Notes (Signed)
Entered room to feed patient dinner.  Pt said "you put that stuff in my mouth to bring me back. i'm tired. I just want to go. let me die." I told him we were talking to his family and trying to help share his wishes to which he said "try harder. i'm just so tired."

## 2020-08-02 DIAGNOSIS — Z515 Encounter for palliative care: Secondary | ICD-10-CM

## 2020-08-02 DIAGNOSIS — N179 Acute kidney failure, unspecified: Secondary | ICD-10-CM | POA: Diagnosis not present

## 2020-08-02 DIAGNOSIS — A419 Sepsis, unspecified organism: Secondary | ICD-10-CM | POA: Diagnosis not present

## 2020-08-02 DIAGNOSIS — Z7189 Other specified counseling: Secondary | ICD-10-CM | POA: Diagnosis not present

## 2020-08-02 DIAGNOSIS — F0391 Unspecified dementia with behavioral disturbance: Secondary | ICD-10-CM | POA: Diagnosis not present

## 2020-08-02 DIAGNOSIS — R6520 Severe sepsis without septic shock: Secondary | ICD-10-CM | POA: Diagnosis not present

## 2020-08-02 DIAGNOSIS — E875 Hyperkalemia: Secondary | ICD-10-CM | POA: Diagnosis not present

## 2020-08-02 LAB — CBC
HCT: 39.4 % (ref 39.0–52.0)
Hemoglobin: 12.1 g/dL — ABNORMAL LOW (ref 13.0–17.0)
MCH: 26.8 pg (ref 26.0–34.0)
MCHC: 30.7 g/dL (ref 30.0–36.0)
MCV: 87.4 fL (ref 80.0–100.0)
Platelets: 278 10*3/uL (ref 150–400)
RBC: 4.51 MIL/uL (ref 4.22–5.81)
RDW: 14.8 % (ref 11.5–15.5)
WBC: 13.4 10*3/uL — ABNORMAL HIGH (ref 4.0–10.5)
nRBC: 0 % (ref 0.0–0.2)

## 2020-08-02 LAB — BASIC METABOLIC PANEL
Anion gap: 17 — ABNORMAL HIGH (ref 5–15)
BUN: 98 mg/dL — ABNORMAL HIGH (ref 8–23)
CO2: 18 mmol/L — ABNORMAL LOW (ref 22–32)
Calcium: 8.7 mg/dL — ABNORMAL LOW (ref 8.9–10.3)
Chloride: 111 mmol/L (ref 98–111)
Creatinine, Ser: 3.04 mg/dL — ABNORMAL HIGH (ref 0.61–1.24)
GFR, Estimated: 19 mL/min — ABNORMAL LOW (ref >60)
Glucose, Bld: 345 mg/dL — ABNORMAL HIGH (ref 70–99)
Potassium: 3.6 mmol/L (ref 3.5–5.1)
Sodium: 146 mmol/L — ABNORMAL HIGH (ref 135–145)

## 2020-08-02 LAB — PHOSPHORUS: Phosphorus: 4.4 mg/dL (ref 2.5–4.6)

## 2020-08-02 LAB — GLUCOSE, CAPILLARY
Glucose-Capillary: 318 mg/dL — ABNORMAL HIGH (ref 70–99)
Glucose-Capillary: 149 mg/dL — ABNORMAL HIGH (ref 70–99)

## 2020-08-02 MED ORDER — SENNOSIDES-DOCUSATE SODIUM 8.6-50 MG PO TABS: 1.0000 | ORAL_TABLET | Freq: Two times a day (BID) | ORAL | Status: DC | PRN

## 2020-08-02 MED ORDER — MORPHINE SULFATE (PF) 2 MG/ML IV SOLN
2.0000 mg | INTRAVENOUS | Status: DC | PRN
  Administered 2020-08-02 (×2): 4 mg via INTRAVENOUS
  Administered 2020-08-03 (×4): 2 mg via INTRAVENOUS
  Filled 2020-08-02: qty 2
  Filled 2020-08-02 (×4): qty 1
  Filled 2020-08-02: qty 2

## 2020-08-02 MED ORDER — MORPHINE SULFATE (PF) 2 MG/ML IV SOLN
1.0000 mg | INTRAVENOUS | Status: DC | PRN
  Administered 2020-08-02 (×4): 2 mg via INTRAVENOUS
  Filled 2020-08-02 (×4): qty 1

## 2020-08-02 MED ORDER — HALOPERIDOL LACTATE 5 MG/ML IJ SOLN: 1.0000 mg | Freq: Four times a day (QID) | INTRAMUSCULAR | Status: DC | PRN

## 2020-08-02 MED ORDER — LORAZEPAM 2 MG/ML IJ SOLN: 1.0000 mg | INTRAMUSCULAR | Status: DC | PRN

## 2020-08-02 MED ORDER — LORAZEPAM 2 MG/ML IJ SOLN
1.0000 mg | INTRAMUSCULAR | Status: DC | PRN
  Administered 2020-08-02: 15:00:00 2 mg via INTRAVENOUS
  Filled 2020-08-02: qty 1

## 2020-08-02 MED ORDER — BISACODYL 10 MG RE SUPP: 10.0000 mg | Freq: Every day | RECTAL | Status: DC | PRN

## 2020-08-02 NOTE — Progress Notes (Signed)
   08/01/20 1943  Assess: MEWS Score  Temp (!) 97.2 F (36.2 C)  BP 123/76  Pulse Rate (!) 128  Resp 20  Level of Consciousness Alert  SpO2 (!) 84 %  O2 Device Room Air  Assess: MEWS Score  MEWS Temp 0  MEWS Systolic 0  MEWS Pulse 2  MEWS RR 0  MEWS LOC 0  MEWS Score 2  MEWS Score Color Yellow  Assess: if the MEWS score is Yellow or Red  Were vital signs taken at a resting state? Yes  Focused Assessment Change from prior assessment (see assessment flowsheet)  Early Detection of Sepsis Score *See Row Information* Low  MEWS guidelines implemented *See Row Information* Yes  Treat  Pain Scale PAINAD  Breathing 0  Negative Vocalization 0  Facial Expression 0  Body Language 0  Consolability 0  PAINAD Score 0  Take Vital Signs  Increase Vital Sign Frequency  Yellow: Q 2hr X 2 then Q 4hr X 2, if remains yellow, continue Q 4hrs  Escalate  MEWS: Escalate Yellow: discuss with charge nurse/RN and consider discussing with provider and RRT  Notify: Charge Nurse/RN  Name of Charge Nurse/RN Notified De [place] Rn  Date Charge Nurse/RN Notified 08/01/20  Time Charge Nurse/RN Notified 1943  Document  Patient Outcome Not stable and remains on department  Progress note created (see row info) Yes  This note is the start of pt care and assessment at the bedside for the initial MEWS VS

## 2020-08-02 NOTE — Progress Notes (Signed)
Daily Progress Note   Patient Name: Shawn May       Date: 08/02/2020 DOB: 1932/06/03  Age: 87 y.o. MRN#: [id number] Attending Physician: Cherene Altes, MD Primary Care Physician: [facility] [place] Date: 07/13/2020  Reason for Consultation/Follow-up: Establishing goals of care  Subjective: Wife is at bedside. Patient is resting in bed with eyes closed. Spoke with wife. She discusses his advancing dementia at home and that she does not want him to suffer. She states she has already spoken with the primary provider today and states "they are going to try to move him to another room to die." Primary RN entered conversation and dicussed patient's swallowing deficit, use of dextrose, patient interactions demonstrating patient's wishes to die, and current plan to shift to comfort care and have family come to visit him in anticipation of hospital death. Orders are in place by primary team for comfort  care. Wife states she does not want any life prolonging care.   I completed a MOST form today with wife and the signed original was placed in the chart and emailed for [facility] upload. A photocopy was placed in the chart to be scanned into EMR. The patient outlined their wishes for the following treatment decisions:  Cardiopulmonary Resuscitation: Do Not Attempt Resuscitation (DNR/No CPR)  Medical Interventions: Comfort Measures: Keep clean, warm, and dry. Use medication by any route, positioning, wound care, and other measures to relieve pain and suffering. Use oxygen, suction and manual treatment of airway obstruction as needed for comfort. Do not transfer to the hospital unless comfort needs cannot be met in current location.  Antibiotics: No antibiotics (use other measures to  relieve symptoms)  IV Fluids: No IV fluids (provide other measures to ensure comfort)  Feeding Tube: No feeding tube    Length of Stay: 11  Current Medications: Scheduled Meds:    Continuous Infusions:   PRN Meds: acetaminophen **OR** acetaminophen, glycopyrrolate, haloperidol lactate, LORazepam, morphine injection, [DISCONTINUED] ondansetron **OR** ondansetron (ZOFRAN) IV, senna-docusate  Physical Exam Constitutional:      Comments: Resting with eyes closed. Even and unlabored respirations.              Vital Signs: BP 126/74 (BP Location: Left Arm)   Pulse 96   Temp 97.6 F (36.4 C)   Resp (!) 26  Ht '5\' 8"'$  (1.727 m)   Wt 55.3 kg   SpO2 96%   BMI 18.55 kg/m  SpO2: SpO2: 96 % O2 Device: O2 Device: High Flow Nasal Cannula O2 Flow Rate: O2 Flow Rate (L/min): 8 L/min  Intake/output summary:   Intake/Output Summary (Last 24 hours) at 08/02/2020 1124 Last data filed at 08/02/2020 0132 Gross per 24 hour  Intake 359.17 ml  Output --  Net 359.17 ml   LBM: Last BM Date:  (unknown) Baseline Weight: Weight: 72.6 kg Most recent weight: Weight: 55.3 kg           Patient Active Problem List   Diagnosis Date Noted  . AKI (acute kidney injury) ([place]) 07/14/2020  . Sepsis ([place]) 07/20/2020  . Acute lower UTI 07/17/2020  . Dementia ([place])   . Type 2 diabetes mellitus with stage 3 chronic kidney disease ([place])   . CVA (cerebral vascular accident) ([place]) 09/11/2018    Palliative Care Assessment & Plan   Recommendations/Plan:  Patient shifted to comfort care by primary team. Wife confirms she does not want life prolonging care and only wants to focus on comfort until his death.   Code Status:    Code Status Orders  (From admission, onward)         Start     Ordered   07/27/2020 1522  Do not attempt resuscitation (DNR)  Continuous       Question Answer Comment  In the event of cardiac or respiratory ARREST Do not call a "code blue"   In the event of cardiac or  respiratory ARREST Do not perform Intubation, CPR, defibrillation or ACLS   In the event of cardiac or respiratory ARREST Use medication by any route, position, wound care, and other measures to relive pain and suffering. May use oxygen, suction and manual treatment of airway obstruction as needed for comfort.   Comments Discussed CODE STATUS with patient's wife over the phone and he is a DO NOT RESUSCITATE      07/26/2020 1525        Code Status History    Date Active Date Inactive Code Status Order ID Comments User Context   09/11/2018 1554 09/12/2018 1827 DNR [id number]  Bettey Costa, MD ED   09/11/2018 1526 09/11/2018 1554 Full Code [id number]  Bettey Costa, MD ED   Advance Care Planning Activity       Prognosis:   Hours - Days  Discharge Planning:  Anticipated Hospital Death  Care plan was discussed with RN  Thank you for allowing the Palliative Medicine Team to assist in the care of this patient.   Total Time 35 min Prolonged Time Billed no      Greater than 50%  of this time was spent counseling and coordinating care related to the above assessment and plan.  Asencion Gowda, NP  Please contact Palliative Medicine Team phone at [phone] for questions and concerns.

## 2020-08-02 NOTE — Progress Notes (Signed)
Rapid Response Event Note     Reason for Call : Tachypnea and air hunger   Initial Focused Assessment:   On arrival to bedside, patient alert experiencing tachypnea and air hunger. Care RN at patients bedside offering comfort.      Interventions:  Dr. Mitchell Heir notified orders received for morphine 0.'5mg'$ -'1mg'$  Q2 hours  Plan of Care:  Supportive care at this time    Event Summary:   MD Notified:  Call Time: Arrival Time: End Time:  Johnathan Hausen, RN

## 2020-08-02 NOTE — Progress Notes (Signed)
   08/01/20 2059  Notify: Provider  Response See new orders  Dr Sidney Ace gave order via secure chat for Morphine 0.5-1.'0mg'$  every 2 hrs for pulmonary congestion and dyspnea

## 2020-08-02 NOTE — Progress Notes (Signed)
PT Cancellation Note  Patient Details Name: Shawn May MRN: [id number] DOB: December 21, 1931   Cancelled Treatment:    Reason Eval/Treat Not Completed: Other (comment). PT orders cancelled due to pt transitioning to comfort care per RN, PT to sign off.   Lieutenant Diego PT, DPT 10:51 AM,08/02/20

## 2020-08-02 NOTE — Progress Notes (Signed)
Shawn May  OTL:[id number] DOB: [birth year] DOA: 07/11/2020 PCP: [facility] [place]    Brief Narrative:  [phone] with a history of dementia, CVA, DM 2, CKD stage IV, HTN, and HLD who was brought to the ER by EMS with shortness of breath and severe generalized weakness. His family reported very poor oral intake for 2-3 days with increasing weakness. EMS found the patient with saturations in the 80s on room air.  Significant Events:  1/20 admit via ED 1/24 dialysis catheter removed  Date of Positive COVID Test:  07/25/2020  Date CoViD Isolation Ends: 08/02/20 (i.e. 1/30 is last full day of isolation)  Vaccination Status: Unknown  COVID-19 specific Treatment: Solu-Medrol 1/23 > 1/25  Antimicrobials:  Ceftriaxone 1/20 > 1/23 Keflex 1/24 >  DVT prophylaxis: Subcutaneous heparin  Subjective: The patient has suffered a significant downward turn over the last 24-36 hours.  He appears to be aspirating even just his oral secretions.  He is frequently telling nurses he is ready to die and "just let me go." His renal function has worsened again. Palliative Care and his RN met with family at the bedside, and all agreed with transitioning to comfort focused care.   Assessment & Plan:  Severe sepsis POA - pansensitive E. coli UTI Has completed 7 days of antibiotic therapy - no indication of persisting infection  Acute kidney failure on CKD stage IV Baseline creatinine 2.46 -creatinine 6.83 at time of admission -followed by [facility]phrology at [facility] -required urgent dialysis for significant azotemia and hyperkalemia - Nephrology following at [facility] - I do not think he is an appropriate candidate for HD at this time due to his frail state  Recent Labs  Lab 07/29/20 0553 07/30/20 0522 07/31/20 0328 08/01/20 0948 08/02/20 0627  CREATININE 2.83* 2.40* 2.77* 2.27* 3.04*    Acute metabolic encephalopathy with baseline dementia Mental status waxing and waning - most likely due to azotemia  > discontinued steroid - is presently in a state of decline   Hypernatremia Due to poor intake/free water deficit - now to the point his mental state will not allow for safe oral intake   Hyperkalemia corrected with dialysis and improved renal function   Hypokalemia Due to poor oral intake  CoViD infection CXR clear -  in absence of hypoxia there is no indication for steroid and therefore this has been discontinued - appears this was essentially asymptomatic - has now completed a 10 day isolation period and can be removed from isolation precautions   DM2 with chronic kidney disease A1c 8.4 - must stop IVF in setting of progressive renal failure to avoid uncomfortable volume overload - monitor CBG   Dysphagia  continued D1 diet with nectar thick liquids per SLP - intake has been very limited, and uremia/encephalopathy is leading to worsening dysphagia to point he is having difficulty managing his secretions   Acute urinary retention Foley catheter present  Underweight - Body mass index is 18.55 kg/m.   Code Status: NO CODE BLUE Family Communication:  Status is: Inpatient  Remains inpatient appropriate because:Unsafe d/c plan   Dispo: The patient is from: Home              Anticipated d/c is to: expect hospital death              Anticipated d/c date is: 1 day              Patient currently is not medically stable to d/c.   Difficult to  place patient No   Consultants:  Nephrology  Objective: Blood pressure 126/74, pulse 93, temperature 97.6 F (36.4 C), resp. rate 20, height $RemoveBe'5\' 8"'MIERDoaKn$  (1.727 m), weight 55.3 kg, SpO2 99 %.  Intake/Output Summary (Last 24 hours) at 08/02/2020 0843 Last data filed at 08/02/2020 0132 Gross per 24 hour  Intake 599.17 ml  Output -  Net 599.17 ml   Filed Weights   08/01/2020 1056 07/25/20 0105  Weight: 72.6 kg 55.3 kg    Examination: No evidence of resp distress at time of visit - resting comfortably  CBC: Recent Labs  Lab  07/27/20 0731 07/28/20 0554 08/02/20 0627  WBC 19.8* 18.0* 13.4*  HGB 12.0* 11.7* 12.1*  HCT 36.4* 36.1* 39.4  MCV 83.3 83.8 87.4  PLT 465* 475* 008   Basic Metabolic Panel: Recent Labs  Lab 07/27/20 0731 07/28/20 0554 07/29/20 0553 07/30/20 0522 07/31/20 0328 08/01/20 0948 08/02/20 0627  NA 145 141   < > 149* 148* 149* 146*  K 3.7 3.7   < > 3.3* 3.4* 3.5 3.6  CL 106 104   < > 110 112* 111 111  CO2 24 22   < > $R'25 23 22 'KH$ 18*  GLUCOSE 322* 376*   < > 212* 278* 233* 345*  BUN 72* 75*   < > 85* 96* 88* 98*  CREATININE 2.45* 2.35*   < > 2.40* 2.77* 2.27* 3.04*  CALCIUM 8.6* 8.7*   < > 9.0 9.2 8.9 8.7*  MG 1.9 2.1  --   --   --   --   --   PHOS  --   --   --  2.8  --  3.3 4.4   < > = values in this interval not displayed.   GFR: Estimated Creatinine Clearance: 13.6 mL/min (A) (by C-G formula based on SCr of 3.04 mg/dL (H)).  Liver Function Tests: Recent Labs  Lab 07/29/20 0553 07/30/20 0522  AST 29  --   ALT 36  --   ALKPHOS 70  --   BILITOT 0.3  --   PROT 6.4*  --   ALBUMIN 2.7* 2.9*    HbA1C: Hgb A1c MFr Bld  Date/Time Value Ref Range Status  07/06/2020 03:26 PM 8.4 (H) 4.8 - 5.6 % Final    Comment:    (NOTE) Pre diabetes:          5.7%-6.4%  Diabetes:              >6.4%  Glycemic control for   <7.0% adults with diabetes   09/12/2018 05:40 AM 11.1 (H) 4.8 - 5.6 % Final    Comment:    (NOTE) Pre diabetes:          5.7%-6.4% Diabetes:              >6.4% Glycemic control for   <7.0% adults with diabetes     CBG: Recent Labs  Lab 08/01/20 1234 08/01/20 1717 08/01/20 1729 08/01/20 1945 08/02/20 0834  GLUCAP 272* 28* 161* 146* 318*     Scheduled Meds: . heparin  5,000 Units Subcutaneous Q8H  . insulin aspart  0-20 Units Subcutaneous TID WC     LOS: 11 days   Cherene Altes, MD [facility] Office  [phone] Pager - Text Page per Shea Evans  If 7PM-7AM, please contact night-coverage per Amion 08/02/2020, 8:43 AM

## 2020-08-02 NOTE — Plan of Care (Signed)
  Problem: Education: Goal: Knowledge of risk factors and measures for prevention of condition will improve Outcome: Not Progressing   Problem: Coping: Goal: Psychosocial and spiritual needs will be supported Outcome: Not Progressing   Problem: Respiratory: Goal: Will maintain a patent airway Outcome: Not Progressing Goal: Complications related to the disease process, condition or treatment will be avoided or minimized Outcome: Not Progressing   Problem: Urinary Elimination: Goal: Signs and symptoms of infection will decrease Outcome: Not Progressing   Problem: Education: Goal: Knowledge of General Education information will improve Description: Including pain rating scale, medication(s)/side effects and non-pharmacologic comfort measures Outcome: Not Progressing   Problem: Clinical Measurements: Goal: Ability to maintain clinical measurements within normal limits will improve Outcome: Not Progressing Goal: Will remain free from infection Outcome: Not Progressing Goal: Diagnostic test results will improve Outcome: Not Progressing Goal: Respiratory complications will improve Outcome: Not Progressing Goal: Cardiovascular complication will be avoided Outcome: Not Progressing   Problem: Elimination: Goal: Will not experience complications related to bowel motility Outcome: Not Progressing Goal: Will not experience complications related to urinary retention Outcome: Not Progressing   Problem: Pain Managment: Goal: General experience of comfort will improve Outcome: Not Progressing   Problem: Safety: Goal: Ability to remain free from injury will improve Outcome: Not Progressing   Problem: Skin Integrity: Goal: Risk for impaired skin integrity will decrease Outcome: Not Progressing

## 2020-08-02 NOTE — Progress Notes (Signed)
   08/01/20 2141  Assess: MEWS Score  Temp 98 F (36.7 C)  BP 134/88  Pulse Rate (!) 123  Resp (!) 34  SpO2 95 %  O2 Device Nasal Cannula  O2 Flow Rate (L/min) 6 L/min  Assess: MEWS Score  MEWS Temp 0  MEWS Systolic 0  MEWS Pulse 2  MEWS RR 2  MEWS LOC 0  MEWS Score 4  MEWS Score Color Red  Assess: if the MEWS score is Yellow or Red  Were vital signs taken at a resting state? Yes  Focused Assessment Change from prior assessment (see assessment flowsheet)  Early Detection of Sepsis Score *See Row Information* Medium  MEWS guidelines implemented *See Row Information* Yes  Treat  MEWS Interventions Escalated (See documentation below);Administered prn meds/treatments  Take Vital Signs  Increase Vital Sign Frequency  Red: Q 1hr X 4 then Q 4hr X 4, if remains red, continue Q 4hrs  Escalate  MEWS: Escalate Red: discuss with charge nurse/RN and provider, consider discussing with RRT  Notify: Charge Nurse/RN  Name of Charge Nurse/RN Notified De Hollingshead RN  Date Charge Nurse/RN Notified 08/01/20  Time Charge Nurse/RN Notified 2141  Notify: Provider  Provider Name/Title Dr Sidney Ace  Date Provider Notified 08/01/20  Time Provider Notified 2155  Notification Type  (secure chat)  Notification Reason Change in status  Response See new orders  Date of Provider Response 08/01/20  Time of Provider Response 2153  Notify: Rapid Response  Name of Rapid Response RN Notified Donnamae Jude RN  Date Rapid Response Notified 08/01/20  Time Rapid Response Notified 2105  Document  Patient Outcome Not stable and remains on department  Progress note created (see row info) Yes  Inserted for Allen Kell RN

## 2020-08-02 NOTE — Progress Notes (Signed)
   08/02/20 0138  Assess: MEWS Score  Temp (!) 97.4 F (36.3 C)  BP (!) 86/59  Pulse Rate (!) 107  Resp (!) 24  Level of Consciousness Responds to Voice  SpO2 98 %  O2 Device HFNC  O2 Flow Rate (L/min) 10 L/min  Assess: MEWS Score  MEWS Temp 0  MEWS Systolic 1  MEWS Pulse 1  MEWS RR 1  MEWS LOC 1  MEWS Score 4  MEWS Score Color Red  Will continue to monitor pt and follow MEWS guidelines and protocols

## 2020-08-02 NOTE — Progress Notes (Signed)
Wife at bedside and updated on plan of care.

## 2020-08-02 NOTE — Progress Notes (Signed)
   08/02/20 0044  Assess: MEWS Score  Temp 98.7 F (37.1 C)  BP 93/64  Pulse Rate (!) 113  Resp (!) 24  Level of Consciousness Responds to Voice  SpO2 96 %  O2 Device HFNC  O2 Flow Rate (L/min) 10 L/min  Assess: MEWS Score  MEWS Temp 0  MEWS Systolic 1  MEWS Pulse 2  MEWS RR 1  MEWS LOC 1  MEWS Score 5  MEWS Score Color Red  Will continue to follow MEWS guidelines and monitor pt

## 2020-08-02 NOTE — Progress Notes (Signed)
   08/01/20 2047  Notify: Provider  Provider Name/Title Dr Sidney Ace  Date Provider Notified 08/01/20  Time Provider Notified 2047  Notification Type  (secure chat)  Notification Reason Change in status  Secure chat note to Dr Sidney Ace advised him that the pt's respiratory status has changed; placed on O2 3L [place] with increased respiratory rate, audible congestion and pt verbalizing "help me"; PRN Glycopyrrolate given at 1922, oral suction performed able to obtain thick tan results

## 2020-08-03 DIAGNOSIS — N390 Urinary tract infection, site not specified: Secondary | ICD-10-CM | POA: Diagnosis not present

## 2020-08-03 DIAGNOSIS — A419 Sepsis, unspecified organism: Secondary | ICD-10-CM | POA: Diagnosis not present

## 2020-08-03 DIAGNOSIS — N179 Acute kidney failure, unspecified: Secondary | ICD-10-CM | POA: Diagnosis not present

## 2020-08-03 DIAGNOSIS — F0391 Unspecified dementia with behavioral disturbance: Secondary | ICD-10-CM | POA: Diagnosis not present

## 2020-08-03 NOTE — Progress Notes (Signed)
Family at bedside, bereavement cart ordered

## 2020-08-03 NOTE — Progress Notes (Signed)
Shawn May  [id number] DOB: [birth year] DOA: 07/16/2020 PCP: [facility] [place]    Brief Narrative:  [phone] with a history of dementia, CVA, DM 2, CKD stage IV, HTN, and HLD who was brought to the ER by EMS with shortness of breath and severe generalized weakness. His family reported very poor oral intake for 2-3 days with increasing weakness. EMS found the patient with saturations in the 80s on room air.  Significant Events:  1/20 admit via ED 1/24 dialysis catheter removed  Date of Positive COVID Test:  07/17/2020  Date CoViD Isolation Ends: 08/02/20 (i.e. 1/30 is last full day of isolation)  Vaccination Status: Unknown  COVID-19 specific Treatment: Solu-Medrol 1/23 > 1/25   DVT prophylaxis: Subcutaneous heparin  Subjective: Comfort care continues.  Resting comfortably at the time of my visit.  Assessment & Plan:  Severe sepsis POA - pansensitive E. coli UTI Has completed 7 days of antibiotic therapy - no indication of persisting infection  Acute kidney failure on CKD stage IV Baseline creatinine 2.46 -creatinine 6.83 at time of admission -followed by Nephrology at [facility] -required urgent dialysis for significant azotemia and hyperkalemia - [facility]phrology following at [facility] - I do not think he is an appropriate candidate for HD at this time due to his frail state  Recent Labs  Lab 07/29/20 0553 07/30/20 0522 07/31/20 0328 08/01/20 0948 08/02/20 0627  CREATININE 2.83* 2.40* 2.77* 2.27* 3.04*    Acute metabolic encephalopathy with baseline dementia Mental status waxing and waning - most likely due to azotemia > discontinued steroid - is presently in a state of decline   Hypernatremia Due to poor intake/free water deficit - now to the point his mental state will not allow for safe oral intake   Hyperkalemia corrected with dialysis and improved renal function   Hypokalemia Due to poor oral intake  CoViD infection CXR clear -  in absence of hypoxia  there is no indication for steroid and therefore this has been discontinued - appears this was essentially asymptomatic - has now completed a 10 day isolation period and can be removed from isolation precautions   DM2 with chronic kidney disease A1c 8.4 - must stop IVF in setting of progressive renal failure to avoid uncomfortable volume overload - monitor CBG   Dysphagia  continued D1 diet with nectar thick liquids per SLP - intake has been very limited, and uremia/encephalopathy is leading to worsening dysphagia to point he is having difficulty managing his secretions   Acute urinary retention Foley catheter present  Underweight - Body mass index is 18.55 kg/m.   Code Status: NO CODE BLUE Family Communication:  Status is: Inpatient  Remains inpatient appropriate because:Unsafe d/c plan   Dispo: The patient is from: Home              Anticipated d/c is to: expect hospital death              Anticipated d/c date is: 1 day              Patient currently is not medically stable to d/c.   Difficult to place patient No   Consultants:  Nephrology  Objective: Blood pressure 126/74, pulse 96, temperature 97.6 F (36.4 C), resp. rate (!) 26, height '5\' 8"'$  (1.727 m), weight 55.3 kg, SpO2 90 %. No intake or output data in the 24 hours ending 08/03/20 0748 Filed Weights   07/14/2020 1056 07/25/20 0105  Weight: 72.6 kg 55.3 kg  Examination: No evidence of resp distress or uncontrolled pain - resting comfortably  CBC: Recent Labs  Lab 07/28/20 0554 08/02/20 0627  WBC 18.0* 13.4*  HGB 11.7* 12.1*  HCT 36.1* 39.4  MCV 83.8 87.4  PLT 475* 0000000   Basic Metabolic Panel: Recent Labs  Lab 07/28/20 0554 07/29/20 0553 07/30/20 0522 07/31/20 0328 08/01/20 0948 08/02/20 0627  NA 141   < > 149* 148* 149* 146*  K 3.7   < > 3.3* 3.4* 3.5 3.6  CL 104   < > 110 112* 111 111  CO2 22   < > '25 23 22 '$ 18*  GLUCOSE 376*   < > 212* 278* 233* 345*  BUN 75*   < > 85* 96* 88* 98*   CREATININE 2.35*   < > 2.40* 2.77* 2.27* 3.04*  CALCIUM 8.7*   < > 9.0 9.2 8.9 8.7*  MG 2.1  --   --   --   --   --   PHOS  --   --  2.8  --  3.3 4.4   < > = values in this interval not displayed.   GFR: Estimated Creatinine Clearance: 13.6 mL/min (A) (by C-G formula based on SCr of 3.04 mg/dL (H)).  Liver Function Tests: Recent Labs  Lab 07/29/20 0553 07/30/20 0522  AST 29  --   ALT 36  --   ALKPHOS 70  --   BILITOT 0.3  --   PROT 6.4*  --   ALBUMIN 2.7* 2.9*    HbA1C: Hgb A1c MFr Bld  Date/Time Value Ref Range Status  07/08/2020 03:26 PM 8.4 (H) 4.8 - 5.6 % Final    Comment:    (NOTE) Pre diabetes:          5.7%-6.4%  Diabetes:              >6.4%  Glycemic control for   <7.0% adults with diabetes   09/12/2018 05:40 AM 11.1 (H) 4.8 - 5.6 % Final    Comment:    (NOTE) Pre diabetes:          5.7%-6.4% Diabetes:              >6.4% Glycemic control for   <7.0% adults with diabetes     CBG: Recent Labs  Lab 08/01/20 1717 08/01/20 1729 08/01/20 1945 08/02/20 0834 08/02/20 2009  GLUCAP 28* 161* 146* 318* 149*       LOS: 12 days   Cherene Altes, MD [facility] Office  [phone] Pager - Text Page per Amion  If 7PM-7AM, please contact night-coverage per Amion 08/03/2020, 7:48 AM

## 2020-08-03 NOTE — Care Management Important Message (Signed)
Important Message  Patient Details  Name: Shawn May MRN: [id number] Date of Birth: [birth year]   Medicare Important Message Given:  Other (see comment)  Patient is on [place] and out of respect for the patient and family no Important Message from [facility] given.  Juliann Pulse A Mairim Bade 08/03/2020, 7:43 AM

## 2020-08-03 NOTE — Progress Notes (Signed)
Pts wife Regino Schultze given update on pts current status, states that she is coming to hospital now to see pt

## 2020-08-03 NOTE — Progress Notes (Signed)
Pt being transferred from covid unit to [facility], wife made aware and agrees that this is best for pt and family

## 2020-08-03 NOTE — Progress Notes (Signed)
Barnabas Lister RN on [place] given report

## 2020-08-03 DEATH — deceased

## 2020-08-31 NOTE — Discharge Summary (Signed)
° °  Death Summary   Shawn May WQJ:[id number] DOB: 21-Aug-1931 DOA: Jul 23, 2020  PCP: [facility] [place] date: 07/23/2020 Date of Death: August 05, 2020  Final Diagnoses:  Severe sepsis POA Pansensitive E. 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  History of present illness:  87yo with a history of dementia, CVA, DM 2, CKD stage IV, HTN, and HLD who was brought to the ER by EMS with shortness of breath and severe generalized weakness. His family reported very poor oral intake for 2-3 days with increasing weakness. EMS found the patient with saturations in the 80s on room air.  Hospital Course:  The patient was initially admitted to the acute units after he was found to be suffering with sepsis related to a complicated pansensitive E. coli urinary tract infection.  He initially responded favorably to aggressive volume resuscitation and antibiotic therapy.  Likewise his initial acute kidney failure on a baseline of severe chronic kidney disease transiently improved during the initial portion of his hospital stay, aided by short-term transient hemodialysis.  Unfortunately as his hospital course continued the patient began to exhibit significant decline again.  His renal function progressively worsened and he developed severe azotemia.  He was also noted to be suffering with significant dysphagia which progressed to the point that he was having difficulty managing even his own secretions.  The overall clinical picture was that of progressive terminal kidney failure in a patient who was not an appropriate candidate for long-term hemodialysis as well as progressive encephalopathy and dementia likely related to and exacerbated by his kidney failure.  Palliative  care was consulted and met with the patient's family explaining the grave nature of his illness and the fact that despite a short rally he was acutely declining again.  The fact that his renal failure was not reversible and that his dementia was only progressive with nothing available to reverse it was impressed upon the family.  The decision was made to transition to full comfort focused care and the medical team fully supported this decision.  The patient was kept in the hospital and suffered no evidence of uncontrolled distress throughout the remainder of his hospital stay.  On 08/05/20 at 03:25 the patient died peacefully in his hospital room.   Signed:  Cherene Altes  [facility] 08/25/2020, 6:29 PM

## 2020-08-31 NOTE — Death Summary Note (Incomplete)
RN rounded on patient at 0325, patient was absent of respiration,pulse and heartbeat.  Order in for RN to pronounce - Second RN verified change in status/death.  RN informed on call provider, Sharion Settler, NP and [facility].  Awaiting family to arrive before transferring patient to [place].

## 2020-08-31 NOTE — Progress Notes (Signed)
This RN attempted to call wife, to inform of change in status three times on both numbers listed, [phone] and [phone], no answer, unable to leave message on either number.  RN informed on call provider, Sharion Settler, NP and Nurse supervisor.  [place] [facility], spoke with Ronney Asters - patient not a candidate  for donor services; body may be released immediately. Reference # [id number]

## 2020-08-31 NOTE — Progress Notes (Signed)
RN was able reach wife through grandson, Hart Carwin - Wife, Holley Raring informed of change in status and will be coming to hospital.  RN informed [facility] of family's intent to come.

## 2020-08-31 NOTE — Progress Notes (Signed)
This RN attempted to contact patient's wife on both lines again, no answer, unable to leave messages.  Will inform AC.

## 2020-08-31 DEATH — deceased

## 2020-09-09 ENCOUNTER — Ambulatory Visit: Payer: Medicare Other | Admitting: Podiatry
